# Patient Record
Sex: Female | Born: 1990 | Hispanic: No | Marital: Single | Smoking: Never smoker
Health system: Southern US, Community
[De-identification: ages and names within clinical notes are randomized; demographics above are authoritative.]

## PROBLEM LIST (undated history)

## (undated) DIAGNOSIS — R002 Palpitations: Secondary | ICD-10-CM

## (undated) DIAGNOSIS — I10 Essential (primary) hypertension: Secondary | ICD-10-CM

## (undated) DIAGNOSIS — D2330 Other benign neoplasm of skin of unspecified part of face: Secondary | ICD-10-CM

## (undated) DIAGNOSIS — F419 Anxiety disorder, unspecified: Secondary | ICD-10-CM

## (undated) DIAGNOSIS — I499 Cardiac arrhythmia, unspecified: Secondary | ICD-10-CM

## (undated) DIAGNOSIS — R040 Epistaxis: Secondary | ICD-10-CM

## (undated) DIAGNOSIS — F32A Depression, unspecified: Secondary | ICD-10-CM

## (undated) HISTORY — DX: Anxiety disorder, unspecified: F41.9

## (undated) HISTORY — DX: Depression, unspecified: F32.A

## (undated) HISTORY — DX: Palpitations: R00.2

## (undated) HISTORY — DX: Other benign neoplasm of skin of unspecified part of face: D23.30

---
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
## 2010-06-06 ENCOUNTER — Encounter: Payer: Self-pay | Admitting: Cardiology

## 2010-06-07 ENCOUNTER — Ambulatory Visit (INDEPENDENT_AMBULATORY_CARE_PROVIDER_SITE_OTHER): Payer: 59 | Admitting: Cardiology

## 2010-06-07 ENCOUNTER — Encounter: Payer: Self-pay | Admitting: Cardiology

## 2010-06-07 DIAGNOSIS — Z01818 Encounter for other preprocedural examination: Secondary | ICD-10-CM

## 2010-06-07 DIAGNOSIS — Z01810 Encounter for preprocedural cardiovascular examination: Secondary | ICD-10-CM

## 2010-06-07 DIAGNOSIS — R9431 Abnormal electrocardiogram [ECG] [EKG]: Secondary | ICD-10-CM | POA: Insufficient documentation

## 2010-06-07 DIAGNOSIS — R079 Chest pain, unspecified: Secondary | ICD-10-CM | POA: Insufficient documentation

## 2010-06-07 DIAGNOSIS — I499 Cardiac arrhythmia, unspecified: Secondary | ICD-10-CM

## 2010-06-07 HISTORY — DX: Encounter for preprocedural cardiovascular examination: Z01.810

## 2010-06-07 HISTORY — DX: Abnormal electrocardiogram (ECG) (EKG): R94.31

## 2010-06-07 LAB — BASIC METABOLIC PANEL
BUN: 9 mg/dL (ref 6–23)
CO2: 29 mEq/L (ref 19–32)
Calcium: 9.7 mg/dL (ref 8.4–10.5)
Chloride: 102 mEq/L (ref 96–112)
Creatinine, Ser: 0.5 mg/dL (ref 0.4–1.2)
GFR: 158.15 mL/min (ref >60.00)
Glucose, Bld: 85 mg/dL (ref 70–99)
Potassium: 4 mEq/L (ref 3.5–5.1)
Sodium: 139 mEq/L (ref 135–145)

## 2010-06-07 LAB — MAGNESIUM: Magnesium: 2.1 mg/dL (ref 1.5–2.5)

## 2010-06-07 LAB — TSH: TSH: 0.67 u[IU]/mL (ref 0.35–5.50)

## 2010-06-07 NOTE — Progress Notes (Signed)
HPI: Pleasant 20 year old female with no prior cardiac history for evaluation of abnormal electrocardiogram and preoperatively. She does not have dyspnea on exertion, orthopnea, PND, pedal edema, palpitations, syncope. She does notice chest pressure with more extreme activities relieved with rest. It does not radiate. There are no associated symptoms. She was recently being evaluated for a skin condition. She was noted to have an irregular heart rhythm and was seen at urgent care or an electrocardiogram. This revealed sinus rhythm with frequent PACs. Cardiology is asked to evaluate.  No current outpatient prescriptions on file.    No Known Allergies  No past medical history on file.  No past surgical history on file.  History   Social History  . Marital Status: Single    Spouse Name: N/A    Number of Children: N/A  . Years of Education: N/A   Occupational History  .  [employer]   Social History Main Topics  . Smoking status: Never Smoker   . Smokeless tobacco: Never Used  . Alcohol Use: No  . Drug Use: No  . Sexually Active: Not on file   Other Topics Concern  . Not on file   Social History Narrative  . No narrative on file    Family History  Problem Relation Age of Onset  . Heart disease Father     Unknown type    ROS: no fevers or chills, productive cough, hemoptysis, dysphasia, odynophagia, melena, hematochezia, dysuria, hematuria, rash, seizure activity, orthopnea, PND, pedal edema, claudication. Remaining systems are negative.  Physical Exam: General:  Well developed/well nourished in NAD Skin warm/dry Patient not depressed No peripheral clubbing Back-normal HEENT-normal/normal eyelids Neck supple/normal carotid upstroke bilaterally; no bruits; no JVD; no thyromegaly chest - CTA/ normal expansion CV -irregular/normal S1 and S2; no murmurs, rubs or gallops;  PMI nondisplaced Abdomen -NT/ND, no HSM, no mass, + bowel sounds, no bruit 2+  femoral pulses, no bruits Ext-no edema, chords, 2+ DP Neuro-grossly nonfocal  ECG Sinus rhythm with frequent and consecutive PACs. No ST changes. No delta wave noted.

## 2010-06-07 NOTE — Assessment & Plan Note (Signed)
Patient with asymptomatic PACs on electrocardiogram. I will schedule a stress echocardiogram given her chest pain and preoperative evaluation. This is mainly to screen for anomalous coronary artery. It will also help quantify LV function. Check TSH, magnesium and potassium.

## 2010-06-07 NOTE — Patient Instructions (Signed)
Your physician recommends that you schedule a follow-up appointment in: AS NEEDED  Your physician recommends that you continue on your current medications as directed. Please refer to the Current Medication list given to you today.   Your physician has requested that you have a stress echocardiogram. For further information please visit [email or web address] Please follow instruction sheet as given. DX ABN EKG AND  PRE OP    Your physician recommends that you return for lab work in: BMET TSH MAG DX  ABN EKG

## 2010-06-07 NOTE — Assessment & Plan Note (Signed)
Stress echocardiogram as described.

## 2010-06-07 NOTE — Assessment & Plan Note (Signed)
Schedule stress-echocardiogram.

## 2010-06-08 ENCOUNTER — Encounter: Payer: Self-pay | Admitting: *Deleted

## 2010-06-09 ENCOUNTER — Ambulatory Visit (HOSPITAL_COMMUNITY): Payer: 59 | Attending: Cardiology | Admitting: Radiology

## 2010-06-09 ENCOUNTER — Encounter: Payer: Self-pay | Admitting: Cardiology

## 2010-06-09 ENCOUNTER — Ambulatory Visit (HOSPITAL_BASED_OUTPATIENT_CLINIC_OR_DEPARTMENT_OTHER): Payer: 59 | Admitting: Radiology

## 2010-06-09 ENCOUNTER — Ambulatory Visit (INDEPENDENT_AMBULATORY_CARE_PROVIDER_SITE_OTHER): Payer: 59 | Admitting: Cardiology

## 2010-06-09 DIAGNOSIS — R079 Chest pain, unspecified: Secondary | ICD-10-CM

## 2010-06-09 DIAGNOSIS — I428 Other cardiomyopathies: Secondary | ICD-10-CM

## 2010-06-09 DIAGNOSIS — R0989 Other specified symptoms and signs involving the circulatory and respiratory systems: Secondary | ICD-10-CM

## 2010-06-09 DIAGNOSIS — I429 Cardiomyopathy, unspecified: Secondary | ICD-10-CM | POA: Insufficient documentation

## 2010-06-09 DIAGNOSIS — R9431 Abnormal electrocardiogram [ECG] [EKG]: Secondary | ICD-10-CM

## 2010-06-09 DIAGNOSIS — Z01818 Encounter for other preprocedural examination: Secondary | ICD-10-CM

## 2010-06-09 HISTORY — DX: Cardiomyopathy, unspecified: I42.9

## 2010-06-09 MED ORDER — METOPROLOL SUCCINATE ER 25 MG PO TB24: 25.0000 mg | ORAL_TABLET | Freq: Every day | ORAL | Status: DC

## 2010-06-09 NOTE — Assessment & Plan Note (Signed)
Await Myoview results prior to proceeding with surgery.

## 2010-06-09 NOTE — Progress Notes (Signed)
HPI: Pleasant 20 year old female with no prior cardiac history I recently saw for evaluation of abnormal electrocardiogram and preoperatively. Patient noted to have frequent PACs and runs of PAT on electrocardiogram. TSH and electrolytes normal. Echocardiogram was performed today. This showed an ejection fraction of 40-45%, global hypokinesis and trace mitral/tricuspid regurgitation.  No current outpatient prescriptions on file.    No Known Allergies  No past medical history on file.  No past surgical history on file.  History   Social History  . Marital Status: Single    Spouse Name: N/A    Number of Children: N/A  . Years of Education: N/A   Occupational History  .  [employer]   Social History Main Topics  . Smoking status: Never Smoker   . Smokeless tobacco: Never Used  . Alcohol Use: No  . Drug Use: No  . Sexually Active: Not on file   Other Topics Concern  . Not on file   Social History Narrative  . No narrative on file    Family History  Problem Relation Age of Onset  . Heart disease Father     Unknown type    ROS: no fevers or chills, productive cough, hemoptysis, dysphasia, odynophagia, melena, hematochezia, dysuria, hematuria, rash, seizure activity, orthopnea, PND, pedal edema, claudication. Remaining systems are negative.  Physical Exam: General:  Well developed/well nourished in NAD Skin warm/dry Patient not depressed No peripheral clubbing Back-normal HEENT-normal/normal eyelids Neck supple/normal carotid upstroke bilaterally; no bruits; no JVD; no thyromegaly chest - CTA/ normal expansion CV -irregular/normal S1 and S2; no murmurs, rubs or gallops;  PMI nondisplaced Abdomen -NT/ND, no HSM, no mass, + bowel sounds, no bruit 2+ femoral pulses, no bruits Ext-no edema, chords, 2+ DP Neuro-grossly nonfocal  ECG Sinus rhythm with frequent and consecutive PACs.

## 2010-06-09 NOTE — Patient Instructions (Signed)
Your physician recommends that you schedule a follow-up appointment in: 6 WEEKS WITH DR CRENSHAW  Your physician has recommended you make the following change in your medication: START TOPROL 25 MG EVERY DAY   Your physician has requested that you have a lexiscan myoview. For further information please visit [email or web address] Please follow instruction sheet, as given.

## 2010-06-09 NOTE — Assessment & Plan Note (Signed)
Patient has a new diagnosis of cardiomyopathy. Etiology is unclear. Her TSH is normal. There is no history of hypertension or alcohol use. There is no family history of cardiomyopathy. She does have chest pain with exertion. I would like to schedule a cardiac CT to rule out anomalous coronary artery. However given her frequent PACs and runs of PAT and we will not be able to pursue this at the study cannot be gated. I will schedule a Myoview given her exertional chest pain to screen for the above. I am also concerned that her frequent ectopy may be contributing to her cardiomyopathy. I will at toprol 25 mg daily. We may be limited in advancing the dose because of blood pressure. I will plan repeat echocardiogram in 3 months to see if LV function is improved once ectopy treated.

## 2010-06-09 NOTE — Assessment & Plan Note (Signed)
Myoview as described.

## 2010-06-15 ENCOUNTER — Ambulatory Visit (HOSPITAL_COMMUNITY): Payer: 59 | Attending: Cardiology | Admitting: Radiology

## 2010-06-15 DIAGNOSIS — R9431 Abnormal electrocardiogram [ECG] [EKG]: Secondary | ICD-10-CM

## 2010-06-15 DIAGNOSIS — R0789 Other chest pain: Secondary | ICD-10-CM

## 2010-06-15 DIAGNOSIS — R079 Chest pain, unspecified: Secondary | ICD-10-CM | POA: Insufficient documentation

## 2010-06-15 MED ORDER — REGADENOSON 0.4 MG/5ML IV SOLN
0.4000 mg | Freq: Once | INTRAVENOUS | Status: AC
  Administered 2010-06-15: 0.4 mg via INTRAVENOUS

## 2010-06-15 MED ORDER — TECHNETIUM TC 99M TETROFOSMIN IV KIT
10.6000 | PACK | Freq: Once | INTRAVENOUS | Status: AC | PRN
  Administered 2010-06-15: 11 via INTRAVENOUS

## 2010-06-15 MED ORDER — TECHNETIUM TC 99M TETROFOSMIN IV KIT
33.0000 | PACK | Freq: Once | INTRAVENOUS | Status: AC | PRN
  Administered 2010-06-15: 33 via INTRAVENOUS

## 2010-06-15 NOTE — Progress Notes (Signed)
[facility] SITE 3 NUCLEAR MED [address] [place] [address] [phone]  Cardiology Nuclear Med Study  [name] is a 20 y.o. female [id number] 09/17/1990   Nuclear Med Background Indication for Stress Test:  Evaluation for Ischemia, Surgical Clearance @ [facility], and Abnormal EKG History:  06/11/10 Echo:EF=40-45%, Cardiomyopathy.   Cardiac Risk Factors: Family History - CAD and Lipids  Symptoms:  Chest Pain/Pressure with Exertion    Nuclear Pre-Procedure Caffeine/Decaff Intake:  None NPO After: 8:00pm   Lungs:  Clear.  O2 sat 99% on RA IV 0.9% NS with Angio Cath:  20g  IV Site: R Antecubital  IV Started by:  Stanton Kidney, EMT-P  Chest Size (in):  34 Cup Size: B  Height: 5\' 3"  (1.6 m)  Weight:  124 lb (56.246 kg)  BMI:  Body mass index is 21.97 kg/(m^2). Tech Comments:  Metoprolol held this am    Nuclear Med Study 1 or 2 day study: 1 day  Stress Test Type:  Treadmill/Lexiscan  Reading MD: Arvilla Meres, MD  Order Authorizing Provider:  Olga Millers, MD  Resting Radionuclide: Technetium 39m Tetrofosmin  Resting Radionuclide Dose: 10.6 mCi   Stress Radionuclide:  Technetium 46m Tetrofosmin  Stress Radionuclide Dose: 33.0 mCi           Stress Protocol Rest HR: 80 Stress HR: 137  Rest BP: 102/81 Stress BP: 117/67  Exercise Time (min): 2:00 METS: n/a   Predicted Max HR: 199 bpm % Max HR: 68.84 bpm Rate Pressure Product: 73220   Dose of Adenosine (mg):  n/a Dose of Lexiscan: 0.4 mg  Dose of Atropine (mg): n/a Dose of Dobutamine: n/a mcg/kg/min (at max HR)  Stress Test Technologist: Smiley Houseman, CMA-N  Nuclear Technologist:  Domenic Polite, CNMT     Rest Procedure:  Myocardial perfusion imaging was performed at rest 45 minutes following the intravenous administration of Technetium 12m Tetrofosmin.  Rest ECG: Frequent PAC's.  Stress Procedure:  The patient received IV Lexiscan 0.4 mg over 15-seconds with concurrent low level  exercise and then Technetium 59m Tetrofosmin was injected at 30-seconds while the patient continued walking one more minute.  There were nonspecific T-wave changes and frequent PAC's with Lexiscan.  She c/o chest tightness with infusion.  Quantitative spect images were obtained after a 45-minute delay.  Stress ECG: No significant change from baseline ECG  QPS Raw Data Images:  Normal; no motion artifact; normal heart/lung ratio. Stress Images:  Normal homogeneous uptake in all areas of the myocardium. Rest Images:  Normal homogeneous uptake in all areas of the myocardium. Subtraction (SDS):  No evidence of ischemia. Transient Ischemic Dilatation (Normal <1.22):  0.94 Lung/Heart Ratio (Normal <0.45):  0.30  Quantitative Gated Spect Images QGS EDV: N/A QGS ESV:  N/A   mlQGS cine images:  study not gated QGS EF: Study not gated  Impression Exercise Capacity:  Lexiscan with low level exercise. BP Response:  n/a Clinical Symptoms:  n/a ECG Impression:  Not gated due to frequent and consecutive PACs. Comparison with Prior Nuclear Study: No previous nuclear study performed  Overall Impression:  Normal stress nuclear study. Normal perfusion. EF not assessed due to frequent PACs.       [name]

## 2010-06-16 NOTE — Progress Notes (Signed)
Copy routed to Dr. Crenshaw.[name] ° °

## 2010-06-20 NOTE — Progress Notes (Signed)
PT AWARE OF MYOVIEW RESULTS./CY 

## 2010-06-21 ENCOUNTER — Telehealth: Payer: Self-pay | Admitting: Cardiology

## 2010-06-21 NOTE — Telephone Encounter (Signed)
Pt on metoprolol and pcp said heart doing fine, pt wants to know if she needs to continue to take the med?

## 2010-06-21 NOTE — Telephone Encounter (Signed)
Left a message to call back.

## 2010-06-21 NOTE — Telephone Encounter (Signed)
Pt. States that  her PCP did an EKG during her last office visit, and was told that her  heart was doing fine. Pt. Would like to know if she still needs to continue taken the Metoprolol 25 mg daily. I let pt. Know that according to Dr. Jens Som last office visit, MD added Metoprolol 25 mg daily for her Dx of Cardiomyopathy. Patient verbalized understanding.

## 2010-06-24 ENCOUNTER — Encounter: Payer: Self-pay | Admitting: Cardiology

## 2010-06-24 ENCOUNTER — Telehealth: Payer: Self-pay | Admitting: Cardiology

## 2010-06-24 NOTE — Telephone Encounter (Signed)
Ok for surgery Brian Crenshaw  

## 2010-06-24 NOTE — Telephone Encounter (Signed)
Pt needs to have a letter stating her condition to [facility] dr. Dawna Part fax# [phone]. Pt having laser surgery.

## 2010-06-24 NOTE — Telephone Encounter (Signed)
Lexiscan stress test routed to Dr. Jens Som. Test was normal but I need to make sure she is cleared for surgery. LMTCB.

## 2010-06-24 NOTE — Telephone Encounter (Signed)
Called pt and advised [name] sent her stess test results  to dr Jens Som to read and OK for surgery--pt having laser facial surgery--advised pt to call back next week and ask for [place] for letter for clearance--pt agrees--nt

## 2010-06-24 NOTE — Telephone Encounter (Signed)
Error

## 2010-06-24 NOTE — Telephone Encounter (Signed)
Pt rtn your call pls call [phone]

## 2010-06-28 ENCOUNTER — Encounter: Payer: Self-pay | Admitting: Cardiology

## 2010-06-28 NOTE — Telephone Encounter (Signed)
Surgical clearance set to Dr. Dawna Part. Patient aware.

## 2010-06-28 NOTE — Telephone Encounter (Signed)
Will send surgical clearance.

## 2010-07-06 ENCOUNTER — Encounter: Payer: Self-pay | Admitting: Cardiology

## 2010-07-22 ENCOUNTER — Encounter: Payer: Self-pay | Admitting: Cardiology

## 2010-07-25 ENCOUNTER — Ambulatory Visit: Payer: 59 | Admitting: Cardiology

## 2010-07-26 ENCOUNTER — Encounter: Payer: Self-pay | Admitting: Cardiology

## 2010-07-27 ENCOUNTER — Encounter: Payer: Self-pay | Admitting: Cardiology

## 2010-09-21 ENCOUNTER — Other Ambulatory Visit: Payer: Self-pay | Admitting: Endocrinology

## 2010-09-21 DIAGNOSIS — R102 Pelvic and perineal pain: Secondary | ICD-10-CM

## 2010-09-22 ENCOUNTER — Inpatient Hospital Stay: Admission: RE | Admit: 2010-09-22 | Payer: 59 | Source: Ambulatory Visit

## 2010-09-29 ENCOUNTER — Ambulatory Visit (INDEPENDENT_AMBULATORY_CARE_PROVIDER_SITE_OTHER): Payer: 59 | Admitting: Cardiology

## 2010-09-29 ENCOUNTER — Encounter: Payer: Self-pay | Admitting: Cardiology

## 2010-09-29 DIAGNOSIS — I428 Other cardiomyopathies: Secondary | ICD-10-CM

## 2010-09-29 DIAGNOSIS — R079 Chest pain, unspecified: Secondary | ICD-10-CM

## 2010-09-29 MED ORDER — METOPROLOL SUCCINATE ER 25 MG PO TB24: 25.0000 mg | ORAL_TABLET | Freq: Every day | ORAL | Status: DC

## 2010-09-29 NOTE — Progress Notes (Signed)
[id number] 20 year old female with no prior cardiac history I recently saw for evaluation of abnormal electrocardiogram and preoperatively. Patient noted to have frequent PACs and runs of PAT on electrocardiogram. TSH and electrolytes normal. Echocardiogram was performed today. This showed an ejection fraction of 40-45%, global hypokinesis and trace mitral/tricuspid regurgitation. Myoview in May of 2012 showed normal perfusion; not gated due to ectopy.  I last saw her in May of 2012. Since then, the patient denies any dyspnea on exertion, orthopnea, PND, pedal edema, palpitations, syncope or chest pain.    Current Outpatient Prescriptions  Medication Sig Dispense Refill  . metoprolol succinate (TOPROL XL) 25 MG 24 hr tablet Take 1 tablet (25 mg total) by mouth daily.  30 tablet  11     Past Medical History  Diagnosis Date  . Abnormal EKG   . Cardiomyopathy     No past surgical history on file.  History   Social History  . Marital Status: Single    Spouse Name: N/A    Number of Children: N/A  . Years of Education: N/A   Occupational History  .  [employer]   Social History Main Topics  . Smoking status: Never Smoker   . Smokeless tobacco: Never Used  . Alcohol Use: No  . Drug Use: No  . Sexually Active: Not on file   Other Topics Concern  . Not on file   Social History Narrative  . No narrative on file    ROS: no fevers or chills, productive cough, hemoptysis, dysphasia, odynophagia, melena, hematochezia, dysuria, hematuria, rash, seizure activity, orthopnea, PND, pedal edema, claudication. Remaining systems are negative.  Physical Exam: Well-developed well-nourished in no acute distress.  Skin is warm and dry.  HEENT is normal.  Neck is supple. No thyromegaly.  Chest is clear to auscultation with normal expansion.  Cardiovascular exam is regular rate and rhythm.  Abdominal exam nontender or distended. No masses palpated. Extremities show no  edema. neuro grossly intact  ECG normal sinus rhythm at a rate of 78. Nonspecific ST changes.

## 2010-09-29 NOTE — Assessment & Plan Note (Signed)
Etiology unclear. Previous TSH normal. No history of alcohol abuse. Myoview does not suggest coronary disease and would be unlikely in a 20 year old female. She had frequent PVCs previously which may have contributed. Her palpitations have now improved. Continue Toprol. Repeat echocardiogram in November to see if LV function has improved after treating with beta blocker.

## 2010-09-29 NOTE — Patient Instructions (Addendum)
Your physician has requested that you have an echocardiogram. Echocardiography is a painless test that uses sound waves to create images of your heart. It provides your doctor with information about the size and shape of your heart and how well your heart's chambers and valves are working. This procedure takes approximately one hour. There are no restrictions for this procedure. [place]  Your physician recommends that you schedule a follow-up appointment in:  6 months with Dr. Jens Som

## 2010-09-29 NOTE — Assessment & Plan Note (Signed)
No further symptoms. Myoview negative. 

## 2010-10-06 ENCOUNTER — Other Ambulatory Visit: Payer: Self-pay | Admitting: *Deleted

## 2010-10-06 MED ORDER — METOPROLOL SUCCINATE ER 25 MG PO TB24: 25.0000 mg | ORAL_TABLET | Freq: Every day | ORAL | Status: DC

## 2010-12-13 ENCOUNTER — Ambulatory Visit (HOSPITAL_COMMUNITY): Payer: 59 | Attending: Cardiology | Admitting: Radiology

## 2010-12-13 DIAGNOSIS — I059 Rheumatic mitral valve disease, unspecified: Secondary | ICD-10-CM | POA: Insufficient documentation

## 2010-12-13 DIAGNOSIS — I079 Rheumatic tricuspid valve disease, unspecified: Secondary | ICD-10-CM | POA: Insufficient documentation

## 2010-12-13 DIAGNOSIS — R072 Precordial pain: Secondary | ICD-10-CM

## 2010-12-13 DIAGNOSIS — I428 Other cardiomyopathies: Secondary | ICD-10-CM | POA: Insufficient documentation

## 2010-12-13 DIAGNOSIS — R079 Chest pain, unspecified: Secondary | ICD-10-CM | POA: Insufficient documentation

## 2011-06-21 ENCOUNTER — Ambulatory Visit (INDEPENDENT_AMBULATORY_CARE_PROVIDER_SITE_OTHER): Payer: 59 | Admitting: Physician Assistant

## 2011-06-21 VITALS — BP 110/74 | HR 71 | Temp 98.3°F | Resp 16 | Ht 63.0 in | Wt 141.8 lb

## 2011-06-21 DIAGNOSIS — Z111 Encounter for screening for respiratory tuberculosis: Secondary | ICD-10-CM

## 2011-06-21 NOTE — Progress Notes (Signed)
   Patient ID: [name] MRN: [id number], DOB: 1990/02/15, 21 y.o. Date of Encounter: 06/21/2011, 4:34 PM  Primary Physician: No primary provider on file.  Chief Complaint: PPD placement  HPI: 21 y.o. year old female here for PPD. No prior positive PPD. Asymptomatic. Needs for job. Plans to work as a Lawyer. See scanned in yellow sheet.     Past Medical History  Diagnosis Date  . Abnormal EKG   . Cardiomyopathy      Home Meds: Prior to Admission medications   Medication Sig Start Date End Date Taking? Authorizing Provider  metoprolol succinate (TOPROL XL) 25 MG 24 hr tablet Take 1 tablet (25 mg total) by mouth daily. 10/06/10 10/06/11 Yes Lewayne Bunting, MD    Allergies: No Known Allergies  History   Social History  . Marital Status: Single    Spouse Name: N/A    Number of Children: N/A  . Years of Education: N/A   Occupational History  .  [employer]   Social History Main Topics  . Smoking status: Never Smoker   . Smokeless tobacco: Never Used  . Alcohol Use: No  . Drug Use: No  . Sexually Active: Not on file   Other Topics Concern  . Not on file   Social History Narrative  . No narrative on file     Review of Systems: Constitutional: negative for chills, fever, night sweats, weight changes, or fatigue  HEENT: negative for vision changes, hearing loss, or epistaxis Cardiovascular: negative for chest pain or palpitations Respiratory: negative for hemoptysis, wheezing, shortness of breath, or cough Abdominal: negative for abdominal pain, nausea, vomiting, diarrhea, or constipation Dermatological: negative for rash Neurologic: negative for headache, dizziness, or syncope All other systems reviewed and are otherwise negative with the exception to those above and in the HPI.   Physical Exam: Blood pressure 110/74, pulse 71, temperature 98.3 F (36.8 C), temperature source Oral, resp. rate 16, height 5\' 3"  (1.6 m), weight 141 lb 12.8  oz (64.32 kg)., Body mass index is 25.12 kg/(m^2). General: Well developed, well nourished, in no acute distress. Head: Normocephalic, atraumatic, eyes without discharge, sclera non-icteric, nares are without discharge.   Neck: Supple. No thyromegaly. Full ROM. No lymphadenopathy. Lungs: Clear bilaterally to auscultation without wheezes, rales, or rhonchi. Breathing is unlabored. Heart: RRR with S1 S2. No murmurs, rubs, or gallops appreciated. Msk:  Strength and tone normal for age. Extremities/Skin: Warm and dry. No clubbing or cyanosis. No edema. No rashes or suspicious lesions. Neuro: Alert and oriented X 3. Moves all extremities spontaneously. Gait is normal. CNII-XII grossly in tact. Psych:  Responds to questions appropriately with a normal affect.     ASSESSMENT AND PLAN:  21 y.o. year old female here for PPD. -PPD placed -RTC 48-72 hours for reading  Signed, Eula Listen, PA-C 06/21/2011 4:34 PM

## 2011-06-22 ENCOUNTER — Encounter: Payer: Self-pay | Admitting: *Deleted

## 2011-06-23 ENCOUNTER — Ambulatory Visit (INDEPENDENT_AMBULATORY_CARE_PROVIDER_SITE_OTHER): Payer: 59

## 2011-06-23 ENCOUNTER — Encounter (INDEPENDENT_AMBULATORY_CARE_PROVIDER_SITE_OTHER): Payer: 59

## 2011-06-23 ENCOUNTER — Encounter: Payer: Self-pay | Admitting: Cardiology

## 2011-06-23 ENCOUNTER — Ambulatory Visit (INDEPENDENT_AMBULATORY_CARE_PROVIDER_SITE_OTHER): Payer: 59 | Admitting: Cardiology

## 2011-06-23 VITALS — BP 110/70 | HR 67 | Ht 63.0 in | Wt 143.0 lb

## 2011-06-23 DIAGNOSIS — R002 Palpitations: Secondary | ICD-10-CM

## 2011-06-23 DIAGNOSIS — Z111 Encounter for screening for respiratory tuberculosis: Secondary | ICD-10-CM

## 2011-06-23 HISTORY — DX: Palpitations: R00.2

## 2011-06-23 NOTE — Assessment & Plan Note (Signed)
Symptoms sound potentially to be SVT. Plan CardioNet to further evaluate.

## 2011-06-23 NOTE — Assessment & Plan Note (Signed)
LV function has improved. Her previous reduction may have been related to her ectopy. Continue beta blocker.

## 2011-06-23 NOTE — Patient Instructions (Signed)
The current medical regimen is effective;  continue present plan and medications.  Your physician has recommended that you wear an event monitor for 21 days. Event monitors are medical devices that record the heart's electrical activity. Doctors most often [place] these monitors to diagnose arrhythmias. Arrhythmias are problems with the speed or rhythm of the heartbeat. The monitor is a small, portable device. You can wear one while you do your normal daily activities. This is usually used to diagnose what is causing palpitations/syncope (passing out).  Follow up with Dr Jens Som in 2 months

## 2011-06-23 NOTE — Progress Notes (Signed)
   HPI: Pleasant female for fu of CM. Patient previously noted to have frequent PACs and runs of PAT on electrocardiogram. TSH and electrolytes normal. Echocardiogram was performed and showed an ejection fraction of 40-45%, global hypokinesis and trace mitral/tricuspid regurgitation. Myoview in May of 2012 showed normal perfusion; not gated due to ectopy. Patient's reduced LV function felt possibly secondary to ectopy. Beta blocker added. Repeat echocardiogram in November 2012 showed normalization of ejection fraction at 55-60%. Since I last saw her I last saw her, she has had intermittent palpitations. She describes these as her heart racing. These are sudden in onset and can last up to 10 minutes. They resolve spontaneously. No associated symptoms. No syncope.   Current Outpatient Prescriptions  Medication Sig Dispense Refill  . metoprolol succinate (TOPROL XL) 25 MG 24 hr tablet Take 1 tablet (25 mg total) by mouth daily.  90 tablet  3     Past Medical History  Diagnosis Date  . Cardiomyopathy     History reviewed. No pertinent past surgical history.  History   Social History  . Marital Status: Single    Spouse Name: N/A    Number of Children: N/A  . Years of Education: N/A   Occupational History  .  [employer]   Social History Main Topics  . Smoking status: Never Smoker   . Smokeless tobacco: Never Used  . Alcohol Use: No  . Drug Use: No  . Sexually Active: Not on file   Other Topics Concern  . Not on file   Social History Narrative  . No narrative on file    ROS: no fevers or chills, productive cough, hemoptysis, dysphasia, odynophagia, melena, hematochezia, dysuria, hematuria, rash, seizure activity, orthopnea, PND, pedal edema, claudication. Remaining systems are negative.  Physical Exam: Well-developed well-nourished in no acute distress.  Skin is warm and dry.  HEENT is normal.  Neck is supple.  Chest is clear to auscultation with normal  expansion.  Cardiovascular exam is regular rate and rhythm.  Abdominal exam nontender or distended. No masses palpated. Extremities show no edema. neuro grossly intact  ECG sinus rhythm at a rate of 67. Sinus arrhythmia. No ST changes.

## 2011-07-19 ENCOUNTER — Telehealth: Payer: Self-pay | Admitting: *Deleted

## 2011-07-19 NOTE — Telephone Encounter (Signed)
Left message for pt to call, monitor reviewed by dr Jens Som shows sinus with rare PAC and brief PAT with brief atrial fib. Pt to keep follow up appt in July.

## 2011-08-01 ENCOUNTER — Encounter: Payer: Self-pay | Admitting: *Deleted

## 2011-08-01 NOTE — Telephone Encounter (Signed)
Unable to reach pt, letter of results mailed to pt

## 2011-08-28 ENCOUNTER — Encounter: Payer: Self-pay | Admitting: Cardiology

## 2011-08-28 ENCOUNTER — Ambulatory Visit (INDEPENDENT_AMBULATORY_CARE_PROVIDER_SITE_OTHER): Payer: 59 | Admitting: Cardiology

## 2011-08-28 VITALS — BP 117/78 | HR 60 | Ht 63.0 in | Wt 143.0 lb

## 2011-08-28 DIAGNOSIS — R002 Palpitations: Secondary | ICD-10-CM

## 2011-08-28 DIAGNOSIS — I428 Other cardiomyopathies: Secondary | ICD-10-CM

## 2011-08-28 DIAGNOSIS — I429 Cardiomyopathy, unspecified: Secondary | ICD-10-CM

## 2011-08-28 NOTE — Assessment & Plan Note (Signed)
Previously felt possibly secondary to ectopy. Most recent echocardiogram shows improvement to normal.

## 2011-08-28 NOTE — Progress Notes (Signed)
   HPI: Pleasant female for fu of CM. Patient previously noted to have frequent PACs and runs of PAT on electrocardiogram. TSH and electrolytes normal. Echocardiogram was performed and showed an ejection fraction of 40-45%, global hypokinesis and trace mitral/tricuspid regurgitation. Myoview in May of 2012 showed normal perfusion; not gated due to ectopy. Patient's reduced LV function felt possibly secondary to ectopy. Beta blocker added. Repeat echocardiogram in November 2012 showed normalization of ejection fraction at 55-60%. Patient had a cardiac event monitor in May of 2013 that shows sinus rhythm with PACs and PAT (question brief A. Fib). Since I last saw her in May of 2013, she has had occasional brief palpitations per seconds but not sustained. Otherwise no dyspnea, chest pain or syncope. Overall her palpitations have improved.   Current Outpatient Prescriptions  Medication Sig Dispense Refill  . metoprolol succinate (TOPROL XL) 25 MG 24 hr tablet Take 1 tablet (25 mg total) by mouth daily.  90 tablet  3     Past Medical History  Diagnosis Date  . Cardiomyopathy     No past surgical history on file.  History   Social History  . Marital Status: Single    Spouse Name: N/A    Number of Children: N/A  . Years of Education: N/A   Occupational History  .  [employer]   Social History Main Topics  . Smoking status: Never Smoker   . Smokeless tobacco: Never Used  . Alcohol Use: No  . Drug Use: No  . Sexually Active: Not on file   Other Topics Concern  . Not on file   Social History Narrative  . No narrative on file    ROS: no fevers or chills, productive cough, hemoptysis, dysphasia, odynophagia, melena, hematochezia, dysuria, hematuria, rash, seizure activity, orthopnea, PND, pedal edema, claudication. Remaining systems are negative.  Physical Exam: Well-developed well-nourished in no acute distress.  Skin is warm and dry.  HEENT is normal.  Neck  is supple.  Chest is clear to auscultation with normal expansion.  Cardiovascular exam is regular rate and rhythm.  Abdominal exam nontender or distended. No masses palpated. Extremities show no edema. neuro grossly intact

## 2011-08-28 NOTE — Assessment & Plan Note (Signed)
Event monitor shows PACs, brief PAT with question of brief atrial fibrillation. Percent symptoms had improved with low-dose Toprol. We will continue at present dose. We will increase to 50 mg in the future if her symptoms worsen. Her LV function is normal. Note she has no embolic risk factors and therefore does not require anticoagulation.

## 2011-08-28 NOTE — Patient Instructions (Addendum)
Your physician wants you to follow-up in: 6 MONTHS WITH DR CRENSHAW You will receive a reminder letter in the mail two months in advance. If you don't receive a letter, please call our office to schedule the follow-up appointment.  

## 2011-11-07 ENCOUNTER — Ambulatory Visit (INDEPENDENT_AMBULATORY_CARE_PROVIDER_SITE_OTHER): Payer: 59 | Admitting: Family Medicine

## 2011-11-07 DIAGNOSIS — Z23 Encounter for immunization: Secondary | ICD-10-CM

## 2011-11-21 ENCOUNTER — Ambulatory Visit (INDEPENDENT_AMBULATORY_CARE_PROVIDER_SITE_OTHER): Payer: 59 | Admitting: Family Medicine

## 2011-11-21 VITALS — BP 105/78 | HR 77 | Temp 98.4°F | Resp 18 | Wt 139.0 lb

## 2011-11-21 DIAGNOSIS — R21 Rash and other nonspecific skin eruption: Secondary | ICD-10-CM

## 2011-11-21 DIAGNOSIS — L299 Pruritus, unspecified: Secondary | ICD-10-CM

## 2011-11-21 MED ORDER — PERMETHRIN 5 % EX CREA: TOPICAL_CREAM | Freq: Once | CUTANEOUS | Status: DC

## 2011-11-21 MED ORDER — HYDROXYZINE HCL 10 MG PO TABS: 10.0000 mg | ORAL_TABLET | Freq: Three times a day (TID) | ORAL | Status: DC | PRN

## 2011-11-21 NOTE — Progress Notes (Signed)
[facility]:  Office Visit  Chief Complaint:  Chief Complaint  Patient presents with  . Rash    arms, legs and stomach    HPI: [name] is a 21 y.o. female who complains of 2 day h/o rash which started on her hands and then to her chest and down her leg, itches mostly at night. Sister has it as well. She denies new meds, detergents, soaps, foods, travels,s taying at hotel rooms. HAs not tried anythign for this. Has itching between web spaces of fingers. Pt is not sharing bed with anyone in family.   Past Medical History  Diagnosis Date  . Cardiomyopathy   . Heart palpitations   . Facial angiofibroma    History reviewed. No pertinent past surgical history. History   Social History  . Marital Status: Single    Spouse Name: N/A    Number of Children: N/A  . Years of Education: N/A   Occupational History  .  [employer]   Social History Main Topics  . Smoking status: Never Smoker   . Smokeless tobacco: Never Used  . Alcohol Use: No  . Drug Use: No  . Sexually Active: None   Other Topics Concern  . None   Social History Narrative  . None   Family History  Problem Relation Age of Onset  . Heart disease Father     Unknown type   No Known Allergies Prior to Admission medications   Medication Sig Start Date End Date Taking? Authorizing Provider  metoprolol succinate (TOPROL XL) 25 MG 24 hr tablet Take 1 tablet (25 mg total) by mouth daily. 10/06/10 10/06/11  Lewayne Bunting, MD     ROS: The patient denies fevers, chills, night sweats, unintentional weight loss, chest pain, palpitations, wheezing, dyspnea on exertion, nausea, vomiting, abdominal pain, dysuria, hematuria, melena, numbness, weakness, or tingling.  All other systems have been reviewed and were otherwise negative with the exception of those mentioned in the HPI and as above.    PHYSICAL EXAM: Filed Vitals:   11/21/11 1022  BP: 105/78  Pulse: 77    Temp: 98.4 F (36.9 C)  Resp: 18   Filed Vitals:   11/21/11 1022  Weight: 139 lb (63.05 kg)   There is no height on file to calculate BMI.  General: Alert, no acute distress HEENT:  Normocephalic, atraumatic, oropharynx patent.  Cardiovascular:  Regular rate and rhythm, no rubs murmurs or gallops.  No Carotid bruits, radial pulse intact. No pedal edema.  Respiratory: Clear to auscultation bilaterally.  No wheezes, rales, or rhonchi.  No cyanosis, no use of accessory musculature GI: No organomegaly, abdomen is soft and non-tender, positive bowel sounds.  No masses. Skin: + rashes, small excoriated papules in patches on chest, in webs of hands and on thigh Neurologic: Facial musculature symmetric. Psychiatric: Patient is appropriate throughout our interaction. Lymphatic: No cervical lymphadenopathy Musculoskeletal: Gait intact.    LABS: Results for orders placed in visit on 06/21/11  TB SKIN TEST      Component Value Range   TB Skin Test Negative     Induration 0       EKG/XRAY:   Primary read interpreted by Dr. Conley Rolls at [facility].   ASSESSMENT/PLAN: Encounter Diagnoses  Name Primary?  . Rash and nonspecific skin eruption Yes  . Itching     ? Scabies Rx Permethrin lotion apply on body avoid mouth and eyes, perhaps face for  her too since she has angifibromas leave on for 8-12 hrs then wash off. Rx also Vistaril F/u prn   Reinaldo Helt PHUONG, DO 11/21/2011 11:09 AM

## 2012-02-27 ENCOUNTER — Ambulatory Visit (INDEPENDENT_AMBULATORY_CARE_PROVIDER_SITE_OTHER): Payer: 59 | Admitting: Cardiology

## 2012-02-27 ENCOUNTER — Encounter: Payer: Self-pay | Admitting: Cardiology

## 2012-02-27 VITALS — BP 115/80 | HR 87 | Wt 148.0 lb

## 2012-02-27 DIAGNOSIS — I428 Other cardiomyopathies: Secondary | ICD-10-CM

## 2012-02-27 MED ORDER — METOPROLOL SUCCINATE ER 25 MG PO TB24: 25.0000 mg | ORAL_TABLET | Freq: Every day | ORAL | Status: DC

## 2012-02-27 NOTE — Assessment & Plan Note (Signed)
Improved on most recent echo. 

## 2012-02-27 NOTE — Progress Notes (Signed)
   HPI: Pleasant female for fu of CM. Patient previously noted to have frequent PACs and runs of PAT on electrocardiogram. TSH and electrolytes normal. Echocardiogram was performed and showed an ejection fraction of 40-45%, global hypokinesis and trace mitral/tricuspid regurgitation. Myoview in May of 2012 showed normal perfusion; not gated due to ectopy. Patient's reduced LV function felt possibly secondary to ectopy. Beta blocker added. Repeat echocardiogram in November 2012 showed normalization of ejection fraction at 55-60%. Patient had a cardiac event monitor in May of 2013 that shows sinus rhythm with PACs and PAT (question brief A. Fib). Since I last saw her in July of 2013, she has had occasional brief palpitations for seconds but not sustained. Otherwise no dyspnea, chest pain or syncope.    Current Outpatient Prescriptions  Medication Sig Dispense Refill  . hydrOXYzine (ATARAX/VISTARIL) 10 MG tablet Take 1 tablet (10 mg total) by mouth 3 (three) times daily as needed for itching.  30 tablet  0  . metoprolol succinate (TOPROL XL) 25 MG 24 hr tablet Take 1 tablet (25 mg total) by mouth daily.  90 tablet  3  . permethrin (ELIMITE) 5 % cream Apply topically once.  60 g  0     Past Medical History  Diagnosis Date  . Cardiomyopathy   . Heart palpitations   . Facial angiofibroma     No past surgical history on file.  History   Social History  . Marital Status: Single    Spouse Name: N/A    Number of Children: N/A  . Years of Education: N/A   Occupational History  .  [employer]   Social History Main Topics  . Smoking status: Never Smoker   . Smokeless tobacco: Never Used  . Alcohol Use: No  . Drug Use: No  . Sexually Active: Not on file   Other Topics Concern  . Not on file   Social History Narrative  . No narrative on file    ROS: no fevers or chills, productive cough, hemoptysis, dysphasia, odynophagia, melena, hematochezia, dysuria, hematuria,  rash, seizure activity, orthopnea, PND, pedal edema, claudication. Remaining systems are negative.  Physical Exam: Well-developed well-nourished in no acute distress.  Skin is warm and dry.  HEENT is normal.  Neck is supple.  Chest is clear to auscultation with normal expansion.  Cardiovascular exam is regular rate and rhythm.  Abdominal exam nontender or distended. No masses palpated. Extremities show no edema. neuro grossly intact  ECG sinus rhythm at a rate of 87. Right axis deviation.

## 2012-02-27 NOTE — Assessment & Plan Note (Signed)
Control with Toprol.we'll continue.

## 2012-02-27 NOTE — Patient Instructions (Addendum)
Your physician wants you to follow-up in: ONE YEAR WITH DR CRENSHAW You will receive a reminder letter in the mail two months in advance. If you don't receive a letter, please call our office to schedule the follow-up appointment.  

## 2012-03-01 NOTE — Addendum Note (Signed)
Addended by: Reine Just on: 03/01/2012 10:26 AM   Modules accepted: Orders

## 2012-06-18 ENCOUNTER — Ambulatory Visit (INDEPENDENT_AMBULATORY_CARE_PROVIDER_SITE_OTHER): Payer: 59

## 2012-06-18 VITALS — BP 98/62 | HR 76 | Temp 99.0°F | Resp 16 | Ht 62.0 in | Wt 141.0 lb

## 2012-06-18 DIAGNOSIS — Z111 Encounter for screening for respiratory tuberculosis: Secondary | ICD-10-CM

## 2012-06-18 NOTE — Progress Notes (Signed)
  Subjective:    Patient ID: [name], female    DOB: 07/25/1990, 22 y.o.   MRN: [id number]  HPI Pt here for 2 step PPD-works at [employer]-  Ok to give per Rhoderick Moody, PA-C. Pt to RTC 48-72 hrs and then again in 2 weeks for the second placement.    Tuberculosis Risk Questionnaire  1. Were you born outside the [place] in one of the following parts of the world: [place], [place], [place], [place] or [place]?  No  2. Have you traveled outside the [place] and lived for more than one month in one of the following parts of the world: [place], [place], [place], [place] or [place]?  No  3. Do you have a compromised immune system such as from any of the following conditions:HIV/AIDS, organ or bone marrow transplantation, diabetes, immunosuppressive medicines (e.g. Prednisone, Remicaide), leukemia, lymphoma, cancer of the head or neck, gastrectomy or jejunal bypass, end-stage renal disease (on dialysis), or silicosis?  No   4. Have you ever or do you plan on working in: a residential care center, a health care facility, a jail or prison or homeless shelter?  Yes  5. Have you ever: injected illegal drugs, used crack cocaine, lived in a homeless shelter  or been in jail or prison?   No  6. Have you ever been exposed to anyone with infectious tuberculosis?  No   Tuberculosis Symptom Questionnaire  Do you currently have any of the following symptoms?  1. Unexplained cough lasting more than 3 weeks? No  Unexplained fever lasting more than 3 weeks. No   3. Night Sweats (sweating that leaves the bedclothes and sheets wet)   No  4. Shortness of Breath No  5. Chest Pain No  6. Unintentional weight loss  No  7. Unexplained fatigue (very tired for no reason) No     Review of Systems     Objective:   Physical Exam        Assessment & Plan:

## 2012-06-20 ENCOUNTER — Encounter (INDEPENDENT_AMBULATORY_CARE_PROVIDER_SITE_OTHER): Payer: 59

## 2012-06-20 DIAGNOSIS — Z111 Encounter for screening for respiratory tuberculosis: Secondary | ICD-10-CM

## 2012-06-20 LAB — TB SKIN TEST
Induration: 0 mm
TB Skin Test: NEGATIVE

## 2012-08-16 ENCOUNTER — Encounter (HOSPITAL_COMMUNITY): Payer: Self-pay | Admitting: Emergency Medicine

## 2012-08-16 ENCOUNTER — Emergency Department (HOSPITAL_COMMUNITY)
Admission: EM | Admit: 2012-08-16 | Discharge: 2012-08-16 | Disposition: A | Discharge status: Home / Self Care | Payer: 59 | Attending: Emergency Medicine | Admitting: Emergency Medicine

## 2012-08-16 DIAGNOSIS — R042 Hemoptysis: Secondary | ICD-10-CM | POA: Insufficient documentation

## 2012-08-16 DIAGNOSIS — R040 Epistaxis: Secondary | ICD-10-CM | POA: Insufficient documentation

## 2012-08-16 DIAGNOSIS — Z8679 Personal history of other diseases of the circulatory system: Secondary | ICD-10-CM | POA: Insufficient documentation

## 2012-08-16 DIAGNOSIS — H44819 Hemophthalmos, unspecified eye: Secondary | ICD-10-CM | POA: Insufficient documentation

## 2012-08-16 HISTORY — DX: Cardiac arrhythmia, unspecified: I49.9

## 2012-08-16 LAB — CBC
HCT: 40.4 % (ref 36.0–46.0)
Hemoglobin: 13.7 g/dL (ref 12.0–15.0)
WBC: 10.8 10*3/uL — ABNORMAL HIGH (ref 4.0–10.5)

## 2012-08-16 NOTE — ED Notes (Signed)
[id number]<QI> Expected date:<BR> Expected time:<BR> Means of arrival:<BR> Comments:<BR> EMS-bloody nose/eye/mouth

## 2012-08-16 NOTE — ED Provider Notes (Signed)
History    CSN: [id number] Arrival date & time 08/16/12  1628  First MD Initiated Contact with Patient 08/16/12 1732     Chief Complaint  Patient presents with  . Epistaxis  . eye bleed    (Consider location/radiation/quality/duration/timing/severity/associated sxs/prior Treatment) HPI Comments: Patient is a 22 y/o female who presents for epistaxis with onset 2 hours ago. Patient states that she was sitting watching TV when her nose spontaneously began bleeding. She tried applying pressure without relief. Symptoms have been persistent, but mildly improved, since onset. She admits to associated hemoptysis and blood draining from the lower corner of her L eye; bleeding from eye has fully resolved. She denies the use of blood thinners, bleeding disorders, recent trauma, and nose picking. She further denies lightheadedness, dizziness, SOB, syncope, and fever. She states she had this problem 10 years ago and went to see an ENT specialist who applied auralgan to her b/l nares with complete relief of symptoms.  Patient is a 22 y.o. female presenting with nosebleeds. The history is provided by the patient and a parent. No language interpreter was used.  Epistaxis  Past Medical History  Diagnosis Date  . Irregular heartbeat    History reviewed. No pertinent past surgical history. No family history on file. History  Substance Use Topics  . Smoking status: Never Smoker   . Smokeless tobacco: Not on file  . Alcohol Use: No   OB History   Grav Para Term Preterm Abortions TAB SAB Ect Mult Living                 Review of Systems  HENT: Positive for nosebleeds.   All other systems reviewed and are negative.   Allergies  Review of patient's allergies indicates no known allergies.  Home Medications   Current Outpatient Rx  Name  Route  Sig  Dispense  Refill  . metoprolol succinate (TOPROL-XL) 25 MG 24 hr tablet   Oral   Take 25 mg by mouth daily as needed (high blood pressure).           BP 136/84  Pulse 113  Resp 21  Ht 5\' 2"  (1.575 m)  Wt 138 lb 5 oz (62.738 kg)  BMI 25.29 kg/m2  SpO2 98%  LMP 06/16/2012  Physical Exam  Nursing note and vitals reviewed. Constitutional: She is oriented to person, place, and time. She appears well-developed and well-nourished. No distress.  HENT:  Head: Normocephalic and atraumatic.  Right Ear: Tympanic membrane, external ear and ear canal normal.  Left Ear: Tympanic membrane, external ear and ear canal normal.  Nose: No nasal septal hematoma. Epistaxis (L nare; suspect posterior source) is observed.  Mouth/Throat: Uvula is midline and mucous membranes are normal.  No anterior source of bleeding identified in b/l nares. + blood in oropharynx secondary to epistaxis. Airway patent.  Eyes: Conjunctivae and EOM are normal. Pupils are equal, round, and reactive to light. No scleral icterus.  No pain with EOMs. No hyphema, conjunctival injection, or conjunctival hemorrhage b/l. No gross blood appreciated behind superior or inferior eyelid.  Neck: Normal range of motion. Neck supple.  Cardiovascular: Normal rate, regular rhythm and intact distal pulses.   Pulmonary/Chest: Effort normal. No respiratory distress.  Musculoskeletal: Normal range of motion.  Neurological: She is alert and oriented to person, place, and time.  Skin: Skin is warm and dry. No rash noted. She is not diaphoretic. No erythema. No pallor.  Psychiatric: She has a normal mood and affect. Her behavior  is normal.    ED Course  Procedures (including critical care time) Labs Reviewed  CBC - Abnormal; Notable for the following:    WBC 10.8 (*)    All other components within normal limits   No results found.  1. Left-sided epistaxis    MDM  Patient presents for L sided epistaxis. Bleeding controlled with continuous pressure and Afrin. H/H stable. On reexamination, no anterior source of bleeding able to be identified after blood and clots thoroughly  suctioned; suspect posterior epistaxis. Patient gargled with water to clear oropharynx of blood; reexamination of the oropharynx after gargling without evidence of continued bleeding. Patient would like to avoid packing with Rhino Rocket at this time. Patient hemodynamically stable and afebrile. Appropriate for d/c with ENT follow up. Strict return precautions given should bleeding persist or recur. Patient and parents verbalize comfort and understanding with this d/c plan with no unaddressed concerns.     Antony Madura, PA-C 08/16/12 2111

## 2012-08-16 NOTE — ED Notes (Signed)
Pt verbalizes understanding 

## 2012-08-16 NOTE — ED Notes (Addendum)
Pt's father at nurse's desk asking when will pt be seen by a doctor.   Went to EDPs office and made EDP and PAs aware of request and pt's condition.

## 2012-08-16 NOTE — ED Notes (Signed)
Per EMS pt was at home when her nose started bleeding, as well as bleeding coming from her mouth and her left eye.  Pt states that she had nose bleed when she was in the third grade and had to get it packed to stop the bleeding.

## 2012-08-17 ENCOUNTER — Encounter (HOSPITAL_COMMUNITY): Payer: Self-pay | Admitting: Emergency Medicine

## 2012-08-17 ENCOUNTER — Emergency Department (HOSPITAL_COMMUNITY)
Admission: EM | Admit: 2012-08-17 | Discharge: 2012-08-17 | Disposition: A | Discharge status: Home / Self Care | Payer: 59 | Attending: Emergency Medicine | Admitting: Emergency Medicine

## 2012-08-17 DIAGNOSIS — Z8679 Personal history of other diseases of the circulatory system: Secondary | ICD-10-CM | POA: Insufficient documentation

## 2012-08-17 DIAGNOSIS — Z79899 Other long term (current) drug therapy: Secondary | ICD-10-CM | POA: Insufficient documentation

## 2012-08-17 DIAGNOSIS — R040 Epistaxis: Secondary | ICD-10-CM | POA: Insufficient documentation

## 2012-08-17 MED ORDER — AMOXICILLIN-POT CLAVULANATE 500-125 MG PO TABS: 1.0000 | ORAL_TABLET | Freq: Three times a day (TID) | ORAL | Status: DC

## 2012-08-17 MED ORDER — TRAMADOL HCL 50 MG PO TABS: 50.0000 mg | ORAL_TABLET | Freq: Four times a day (QID) | ORAL | Status: DC | PRN

## 2012-08-17 MED ORDER — OXYMETAZOLINE HCL 0.05 % NA SOLN
NASAL | Status: AC
  Filled 2012-08-17: qty 15

## 2012-08-17 MED ORDER — AMOXICILLIN 500 MG PO CAPS
ORAL_CAPSULE | ORAL | Status: AC
  Filled 2012-08-17: qty 1

## 2012-08-17 MED ORDER — AMOXICILLIN-POT CLAVULANATE 500-125 MG PO TABS
1.0000 | ORAL_TABLET | Freq: Three times a day (TID) | ORAL | Status: DC
  Administered 2012-08-17: 500 mg via ORAL
  Filled 2012-08-17 (×3): qty 1

## 2012-08-17 NOTE — ED Notes (Signed)
Pt c/o nosebleed onset yesterday, pt was seen here for same yesterday. Pt is actively bleeding from nares and L eye.

## 2012-08-17 NOTE — ED Provider Notes (Signed)
History    CSN: [id number] Arrival date & time 08/17/12  4098  First MD Initiated Contact with Patient 08/17/12 [phone]     Chief Complaint  Patient presents with  . Epistaxis   (Consider location/radiation/quality/duration/timing/severity/associated sxs/prior Treatment) Patient is a 22 y.o. female presenting with nosebleeds. The history is provided by the patient and a relative.  Epistaxis Location:  L nare Severity:  Severe Duration:  20 minutes Timing:  Constant Progression:  Worsening Chronicity:  Recurrent Context: not anticoagulants, not aspirin use, not BiPAP, not drug use, not elevation change, not foreign body, not nose picking, not recent infection and not thrombocytopenia   Relieved by:  Nothing Ineffective treatments:  Applying pressure Associated symptoms: blood in oropharynx   Risk factors: no alcohol use, no allergies, no change in medication, no frequent nosebleeds, no head and neck surgery, no head and neck tumor, no intranasal steroids, no liver disease, no radiation treatment, no recent chemotherapy, no recent nasal surgery and no sinus problems   Pt seen earlier for left nare bleeding, thought to be posterior.  Resolved with afrin and compression.  Pt reports return of bleeding just prior to arrival, worse than before.  Past Medical History  Diagnosis Date  . Cardiomyopathy   . Heart palpitations   . Facial angiofibroma    History reviewed. No pertinent past surgical history. Family History  Problem Relation Age of Onset  . Heart disease Father     Unknown type   History  Substance Use Topics  . Smoking status: Never Smoker   . Smokeless tobacco: Never Used  . Alcohol Use: No   OB History   Grav Para Term Preterm Abortions TAB SAB Ect Mult Living                 Review of Systems  Unable to perform ROS: Acuity of condition  HENT: Positive for nosebleeds.     Allergies  Review of patient's allergies indicates no known allergies.  Home  Medications   Current Outpatient Rx  Name  Route  Sig  Dispense  Refill  . metoprolol succinate (TOPROL XL) 25 MG 24 hr tablet   Oral   Take 1 tablet (25 mg total) by mouth daily.   90 tablet   3   . amoxicillin-clavulanate (AUGMENTIN) 500-125 MG per tablet   Oral   Take 1 tablet (500 mg total) by mouth 3 (three) times daily.   15 tablet   0   . traMADol (ULTRAM) 50 MG tablet   Oral   Take 1 tablet (50 mg total) by mouth every 6 (six) hours as needed for pain.   15 tablet   0    BP 135/94  Pulse 132  Resp 18  Wt 138 lb (62.596 kg)  BMI 25.23 kg/m2  SpO2 99%  LMP 06/17/2012 Physical Exam  Constitutional: She is oriented to person, place, and time. She appears distressed.  HENT:  Head: Normocephalic and atraumatic.  Mouth/Throat: Oropharynx is clear and moist.  Brisk bleeding from left nare, unable to see source due to amount of bleeding.  Smaller amount from right nare, no source noted anteriorly.    Cardiovascular: Normal rate, regular rhythm, normal heart sounds and intact distal pulses.  Exam reveals no gallop and no friction rub.   No murmur heard. Pulmonary/Chest: Effort normal and breath sounds normal. No respiratory distress. She has no wheezes. She has no rales. She exhibits no tenderness.  Abdominal: Soft. Bowel sounds are normal. She exhibits  no distension and no mass. There is no tenderness. There is no rebound and no guarding.  Musculoskeletal: Normal range of motion. She exhibits no edema and no tenderness.  Neurological: She is alert and oriented to person, place, and time.  Skin: Skin is warm. No rash noted. No erythema. No pallor.    ED Course  EPISTAXIS MANAGEMENT Date/Time: 08/17/2012 3:45 AM Performed by: Olivia Mackie Authorized by: Olivia Mackie Consent: Verbal consent obtained. Risks and benefits: risks, benefits and alternatives were discussed Consent given by: patient Anesthesia: local infiltration Local anesthetic: lidocaine  spray Anesthetic total: 3 ml Patient sedated: no Treatment site: left posterior Repair method: merocel sponge Post-procedure assessment: bleeding stopped Treatment complexity: complex Recurrence: recurrence of recent bleed Patient tolerance: Patient tolerated the procedure well with no immediate complications.   (including critical care time) Labs Reviewed - No data to display No results found. 1. Epistaxis     MDM  22 yo female with recurrent left epistaxis.  Pt with brisk bleeding, controlled with 10 cm merocel packing.  D/w Dr Pollyann Kennedy who wishes to see the patient in the office in 5 days, start on abx prophylaxis in the meantime.  Pt and family updated.  Some slight oozing, but no further frank bleeding.  Pt and family are comfortable with d/c home.  Olivia Mackie, MD 08/17/12 [phone]

## 2012-08-19 ENCOUNTER — Encounter (HOSPITAL_COMMUNITY): Payer: Self-pay | Admitting: Emergency Medicine

## 2012-08-20 NOTE — ED Provider Notes (Signed)
Medical screening examination/treatment/procedure(s) were performed by non-physician practitioner and as supervising physician I was immediately available for consultation/collaboration.  Ngoc Daughtridge, MD 08/20/12 1610 

## 2012-08-25 ENCOUNTER — Encounter (HOSPITAL_COMMUNITY): Payer: Self-pay | Admitting: *Deleted

## 2012-08-25 ENCOUNTER — Emergency Department (HOSPITAL_COMMUNITY)
Admission: EM | Admit: 2012-08-25 | Discharge: 2012-08-26 | Disposition: A | Discharge status: Home / Self Care | Payer: 59 | Attending: Emergency Medicine | Admitting: Emergency Medicine

## 2012-08-25 DIAGNOSIS — Z79899 Other long term (current) drug therapy: Secondary | ICD-10-CM | POA: Insufficient documentation

## 2012-08-25 DIAGNOSIS — Z792 Long term (current) use of antibiotics: Secondary | ICD-10-CM | POA: Insufficient documentation

## 2012-08-25 DIAGNOSIS — Z8679 Personal history of other diseases of the circulatory system: Secondary | ICD-10-CM | POA: Insufficient documentation

## 2012-08-25 DIAGNOSIS — R000 Tachycardia, unspecified: Secondary | ICD-10-CM | POA: Insufficient documentation

## 2012-08-25 DIAGNOSIS — R040 Epistaxis: Secondary | ICD-10-CM | POA: Insufficient documentation

## 2012-08-25 HISTORY — DX: Epistaxis: R04.0

## 2012-08-25 MED ORDER — OXYMETAZOLINE HCL 0.05 % NA SOLN
1.0000 | Freq: Once | NASAL | Status: AC
  Administered 2012-08-25: 1 via NASAL
  Filled 2012-08-25: qty 15

## 2012-08-25 NOTE — ED Provider Notes (Signed)
CSN: [id number]     Arrival date & time 08/25/12  2304 History     First MD Initiated Contact with Patient 08/25/12 2312     Chief Complaint  Patient presents with  . Epistaxis   (Consider location/radiation/quality/duration/timing/severity/associated sxs/prior Treatment) HPI 22 yo female presents to the ER from home with complaint of nosebleed.  Pt was seen by me in the ER on 7/19 for same complaint, had merocel packing placed.  Pt was seen by Dr Pollyann Kennedy in the office on 7/24, had packing removed and NP scope done.  Pt reports she was told no heavy lifting, which she thinks she did tonight.  Bleeding for about 30 minutes heavily.  No anticoagulants, no aspirin, no steroids, liver disease, clotting disorders.    Past Medical History  Diagnosis Date  . Irregular heartbeat   . Cardiomyopathy   . Heart palpitations   . Facial angiofibroma   . Nosebleed    History reviewed. No pertinent past surgical history. Family History  Problem Relation Age of Onset  . Heart disease Father     Unknown type   History  Substance Use Topics  . Smoking status: Never Smoker   . Smokeless tobacco: Never Used  . Alcohol Use: No   OB History   Grav Para Term Preterm Abortions TAB SAB Ect Mult Living                 Review of Systems  All other systems reviewed and are negative.    Allergies  Review of patient's allergies indicates no known allergies.  Home Medications   Current Outpatient Rx  Name  Route  Sig  Dispense  Refill  . amoxicillin-clavulanate (AUGMENTIN) 500-125 MG per tablet   Oral   Take 1 tablet (500 mg total) by mouth 3 (three) times daily.   15 tablet   0   . metoprolol succinate (TOPROL XL) 25 MG 24 hr tablet   Oral   Take 1 tablet (25 mg total) by mouth daily.   90 tablet   3   . metoprolol succinate (TOPROL-XL) 25 MG 24 hr tablet   Oral   Take 25 mg by mouth daily as needed (high blood pressure).         . traMADol (ULTRAM) 50 MG tablet   Oral   Take 1  tablet (50 mg total) by mouth every 6 (six) hours as needed for pain.   15 tablet   0    BP 129/81  Pulse 121  Temp(Src) 98.7 F (37.1 C) (Oral)  Ht 5\' 2"  (1.575 m)  Wt 132 lb (59.875 kg)  BMI 24.14 kg/m2  SpO2 98%  LMP 08/25/2012 Physical Exam  Nursing note and vitals reviewed. Constitutional: She appears well-developed and well-nourished.  HENT:  Head: Normocephalic and atraumatic.  Mouth/Throat: Oropharynx is clear and moist.  Some blood noted in posterior pharynx, not actively bleeding.  No bleeding in right nare.  Heavy bleeding from left nare, does not appear to be anterior source, unable to appreciate posterior.  Cardiovascular: Regular rhythm, normal heart sounds and intact distal pulses.  Exam reveals no gallop and no friction rub.   No murmur heard. Tachycardia noted  Pulmonary/Chest: Effort normal and breath sounds normal. No respiratory distress. She has no wheezes. She has no rales. She exhibits no tenderness.  Abdominal: Soft. Bowel sounds are normal. She exhibits no distension and no mass. There is no tenderness. There is no rebound.  Skin: Skin is warm and dry.  No rash noted. No erythema. No pallor.    ED Course   EPISTAXIS MANAGEMENT Date/Time: 08/25/2012 11:55 PM Performed by: Olivia Mackie Authorized by: Olivia Mackie Consent: Verbal consent obtained. Risks and benefits: risks, benefits and alternatives were discussed Consent given by: patient and parent Patient identity confirmed: verbally with patient Patient sedated: no Repair method: merocel sponge (10 cm) Post-procedure assessment: bleeding decreased Recurrence: recurrence of recent bleed   (including critical care time)  Labs Reviewed - No data to display No results found. 1. Epistaxis, recurrent     MDM  22 yo female with recurrent epistaxis.  Initial management with afrin, pressure without improvement.  Merocel packing placed, with persistent steady dripping of blood.  Will d/w  ENT.   12:58 AM Bleeding now only slow drip.  D/w Dr Annalee Genta on call for Dr Pollyann Kennedy.  He recommends f/u in 3 days in the office.  Will place dressing over nares to catch drips, afrin/saline for drying of packing, abx.  Olivia Mackie, MD 08/26/12 [phone]

## 2012-08-25 NOTE — ED Notes (Addendum)
Active nosebleed times 30 min. Treated for last nose bleed Wed

## 2012-08-26 MED ORDER — AMOXICILLIN-POT CLAVULANATE 500-125 MG PO TABS: 1.0000 | ORAL_TABLET | Freq: Three times a day (TID) | ORAL | Status: DC

## 2012-08-26 MED ORDER — OXYMETAZOLINE HCL 0.05 % NA SOLN: 2.0000 | Freq: Two times a day (BID) | NASAL | Status: DC

## 2012-08-26 MED ORDER — AMOXICILLIN-POT CLAVULANATE 500-125 MG PO TABS
1.0000 | ORAL_TABLET | Freq: Three times a day (TID) | ORAL | Status: DC
  Filled 2012-08-26: qty 1

## 2012-08-28 ENCOUNTER — Encounter (HOSPITAL_COMMUNITY): Payer: Self-pay | Admitting: *Deleted

## 2012-08-28 ENCOUNTER — Ambulatory Visit (HOSPITAL_COMMUNITY): Payer: 59 | Admitting: Anesthesiology

## 2012-08-28 ENCOUNTER — Encounter (HOSPITAL_COMMUNITY): Payer: Self-pay | Admitting: Anesthesiology

## 2012-08-28 ENCOUNTER — Ambulatory Visit (HOSPITAL_COMMUNITY)
Admission: AD | Admit: 2012-08-28 | Discharge: 2012-08-28 | Disposition: A | Discharge status: Home / Self Care | Payer: 59 | Source: Ambulatory Visit | Attending: Otolaryngology | Admitting: Otolaryngology

## 2012-08-28 ENCOUNTER — Encounter (HOSPITAL_COMMUNITY)
Admission: AD | Disposition: A | Discharge status: Home / Self Care | Payer: Self-pay | Source: Ambulatory Visit | Attending: Otolaryngology

## 2012-08-28 DIAGNOSIS — R040 Epistaxis: Secondary | ICD-10-CM

## 2012-08-28 DIAGNOSIS — J342 Deviated nasal septum: Secondary | ICD-10-CM | POA: Insufficient documentation

## 2012-08-28 DIAGNOSIS — Z79899 Other long term (current) drug therapy: Secondary | ICD-10-CM | POA: Insufficient documentation

## 2012-08-28 DIAGNOSIS — R002 Palpitations: Secondary | ICD-10-CM | POA: Insufficient documentation

## 2012-08-28 DIAGNOSIS — I428 Other cardiomyopathies: Secondary | ICD-10-CM | POA: Insufficient documentation

## 2012-08-28 DIAGNOSIS — D367 Benign neoplasm of other specified sites: Secondary | ICD-10-CM | POA: Insufficient documentation

## 2012-08-28 HISTORY — DX: Epistaxis: R04.0

## 2012-08-28 HISTORY — PX: NASAL HEMORRHAGE CONTROL: SHX287

## 2012-08-28 LAB — CBC
HCT: 31.6 % — ABNORMAL LOW (ref 36.0–46.0)
Hemoglobin: 10.7 g/dL — ABNORMAL LOW (ref 12.0–15.0)
MCH: 28.2 pg (ref 26.0–34.0)
MCHC: 33.9 g/dL (ref 30.0–36.0)
MCV: 83.4 fL (ref 78.0–100.0)
RDW: 13.5 % (ref 11.5–15.5)

## 2012-08-28 SURGERY — CONTROL OF EPISTAXIS
Anesthesia: General | Site: Nose | Laterality: Bilateral | Wound class: Clean Contaminated

## 2012-08-28 MED ORDER — NEOSTIGMINE METHYLSULFATE 1 MG/ML IJ SOLN
INTRAMUSCULAR | Status: DC | PRN
  Administered 2012-08-28: 2 mg via INTRAVENOUS

## 2012-08-28 MED ORDER — ONDANSETRON HCL 4 MG/2ML IJ SOLN
INTRAMUSCULAR | Status: DC | PRN
  Administered 2012-08-28: 4 mg via INTRAVENOUS

## 2012-08-28 MED ORDER — LIDOCAINE HCL (CARDIAC) 20 MG/ML IV SOLN
INTRAVENOUS | Status: DC | PRN
  Administered 2012-08-28: 75 mg via INTRAVENOUS

## 2012-08-28 MED ORDER — HYDROMORPHONE HCL PF 1 MG/ML IJ SOLN
INTRAMUSCULAR | Status: AC
  Filled 2012-08-28: qty 1

## 2012-08-28 MED ORDER — OXYMETAZOLINE HCL 0.05 % NA SOLN
NASAL | Status: DC | PRN
  Administered 2012-08-28: 1

## 2012-08-28 MED ORDER — OXYCODONE HCL 5 MG PO TABS: 5.0000 mg | ORAL_TABLET | Freq: Once | ORAL | Status: DC | PRN

## 2012-08-28 MED ORDER — PROPOFOL 10 MG/ML IV BOLUS
INTRAVENOUS | Status: DC | PRN
  Administered 2012-08-28: 150 mg via INTRAVENOUS

## 2012-08-28 MED ORDER — ROCURONIUM BROMIDE 100 MG/10ML IV SOLN
INTRAVENOUS | Status: DC | PRN
  Administered 2012-08-28: 10 mg via INTRAVENOUS

## 2012-08-28 MED ORDER — MUPIROCIN CALCIUM 2 % EX CREA
TOPICAL_CREAM | CUTANEOUS | Status: DC | PRN
  Administered 2012-08-28: 1 via TOPICAL

## 2012-08-28 MED ORDER — ONDANSETRON HCL 4 MG/2ML IJ SOLN: 4.0000 mg | Freq: Once | INTRAMUSCULAR | Status: DC | PRN

## 2012-08-28 MED ORDER — LACTATED RINGERS IV SOLN
INTRAVENOUS | Status: DC | PRN
  Administered 2012-08-28 (×2): via INTRAVENOUS

## 2012-08-28 MED ORDER — LACTATED RINGERS IV SOLN
INTRAVENOUS | Status: DC
  Administered 2012-08-28: 16:00:00 via INTRAVENOUS

## 2012-08-28 MED ORDER — GLYCOPYRROLATE 0.2 MG/ML IJ SOLN
INTRAMUSCULAR | Status: DC | PRN
  Administered 2012-08-28: .4 mg via INTRAVENOUS

## 2012-08-28 MED ORDER — FENTANYL CITRATE 0.05 MG/ML IJ SOLN
INTRAMUSCULAR | Status: DC | PRN
  Administered 2012-08-28: 100 ug via INTRAVENOUS

## 2012-08-28 MED ORDER — LIDOCAINE-EPINEPHRINE 1 %-1:100000 IJ SOLN
INTRAMUSCULAR | Status: DC | PRN
  Administered 2012-08-28: 10 mL

## 2012-08-28 MED ORDER — HYDROMORPHONE HCL PF 1 MG/ML IJ SOLN
0.2500 mg | INTRAMUSCULAR | Status: DC | PRN
  Administered 2012-08-28: 0.25 mg via INTRAVENOUS

## 2012-08-28 MED ORDER — MIDAZOLAM HCL 5 MG/5ML IJ SOLN
INTRAMUSCULAR | Status: DC | PRN
  Administered 2012-08-28: 2 mg via INTRAVENOUS

## 2012-08-28 MED ORDER — DEXAMETHASONE SODIUM PHOSPHATE 10 MG/ML IJ SOLN
10.0000 mg | Freq: Once | INTRAMUSCULAR | Status: AC
  Administered 2012-08-28: 10 mg via INTRAVENOUS

## 2012-08-28 MED ORDER — 0.9 % SODIUM CHLORIDE (POUR BTL) OPTIME
TOPICAL | Status: DC | PRN
  Administered 2012-08-28: 1000 mL

## 2012-08-28 MED ORDER — OXYCODONE HCL 5 MG/5ML PO SOLN: 5.0000 mg | Freq: Once | ORAL | Status: DC | PRN

## 2012-08-28 MED ORDER — CEFAZOLIN SODIUM-DEXTROSE 2-3 GM-% IV SOLR
2.0000 g | Freq: Once | INTRAVENOUS | Status: DC
  Filled 2012-08-28: qty 50

## 2012-08-28 MED ORDER — CEFAZOLIN SODIUM 1-5 GM-% IV SOLN
1.0000 g | Freq: Once | INTRAVENOUS | Status: DC
  Administered 2012-08-28: 1 g via INTRAVENOUS

## 2012-08-28 SURGICAL SUPPLY — 27 items
CANISTER SUCTION 2500CC (MISCELLANEOUS) ×2 IMPLANT
CLOTH BEACON ORANGE TIMEOUT ST (SAFETY) ×2 IMPLANT
COAGULATOR SUCT 8FR VV (MISCELLANEOUS) ×2 IMPLANT
COVER MAYO STAND STRL (DRAPES) ×1 IMPLANT
GAUZE SPONGE 2X2 8PLY STRL LF (GAUZE/BANDAGES/DRESSINGS) IMPLANT
GAUZE SPONGE 4X4 16PLY XRAY LF (GAUZE/BANDAGES/DRESSINGS) ×1 IMPLANT
GAUZE VASELINE FOILPK 1/2 X 72 (GAUZE/BANDAGES/DRESSINGS) IMPLANT
GLOVE BIOGEL M 7.0 STRL (GLOVE) ×2 IMPLANT
GLOVE SURG SS PI 7.5 STRL IVOR (GLOVE) ×1 IMPLANT
GOWN STRL NON-REIN LRG LVL3 (GOWN DISPOSABLE) ×2 IMPLANT
HEMOSTAT SURGICEL .5X2 ABSORB (HEMOSTASIS) IMPLANT
KIT BASIN OR (CUSTOM PROCEDURE TRAY) ×2 IMPLANT
KIT ROOM TURNOVER OR (KITS) ×2 IMPLANT
NDL HYPO 25GX1X1/2 BEV (NEEDLE) ×1 IMPLANT
NEEDLE HYPO 25GX1X1/2 BEV (NEEDLE) IMPLANT
NS IRRIG 1000ML POUR BTL (IV SOLUTION) ×2 IMPLANT
PAD ARMBOARD 7.5X6 YLW CONV (MISCELLANEOUS) ×4 IMPLANT
SOLUTION ANTI FOG 6CC (MISCELLANEOUS) ×1 IMPLANT
SPLINT NASAL THERMO PLAST (MISCELLANEOUS) IMPLANT
SPONGE GAUZE 2X2 STER 10/PKG (GAUZE/BANDAGES/DRESSINGS)
SPONGE NEURO XRAY DETECT 1X3 (DISPOSABLE) ×2 IMPLANT
SUT CHROMIC 4 0 RB 1X27 (SUTURE) ×1 IMPLANT
SYR CONTROL 10ML LL (SYRINGE) ×1 IMPLANT
TOWEL OR 17X24 6PK STRL BLUE (TOWEL DISPOSABLE) ×4 IMPLANT
TRAY ENT MC OR (CUSTOM PROCEDURE TRAY) ×1 IMPLANT
TUBE CONNECTING 12X1/4 (SUCTIONS) ×1 IMPLANT
WATER STERILE IRR 1000ML POUR (IV SOLUTION) ×1 IMPLANT

## 2012-08-28 NOTE — Anesthesia Preprocedure Evaluation (Signed)
Anesthesia Evaluation  Patient identified by MRN, date of birth, ID band Patient awake    Reviewed: Allergy & Precautions, H&P , NPO status , Patient's Chart, lab work & pertinent test results  Airway Mallampati: II TM Distance: >3 FB Neck ROM: Full    Dental  (+) Teeth Intact and Dental Advisory Given   Pulmonary  breath sounds clear to auscultation        Cardiovascular Rhythm:Regular Rate:Normal     Neuro/Psych    GI/Hepatic   Endo/Other    Renal/GU      Musculoskeletal   Abdominal   Peds  Hematology   Anesthesia Other Findings Some question of "cardiomyopathy" but EF is good by 2012 echo.  Reproductive/Obstetrics                           Anesthesia Physical Anesthesia Plan  ASA: II  Anesthesia Plan: General   Post-op Pain Management:    Induction: Intravenous  Airway Management Planned: Oral ETT  Additional Equipment:   Intra-op Plan:   Post-operative Plan: Extubation in OR  Informed Consent: I have reviewed the patients History and Physical, chart, labs and discussed the procedure including the risks, benefits and alternatives for the proposed anesthesia with the patient or authorized representative who has indicated his/her understanding and acceptance.   Dental advisory given  Plan Discussed with: CRNA, Anesthesiologist and Surgeon  Anesthesia Plan Comments:         Anesthesia Quick Evaluation

## 2012-08-28 NOTE — Brief Op Note (Signed)
08/28/2012  5:36 PM  PATIENT:  [name]  22 y.o. female  PRE-OPERATIVE DIAGNOSIS:  Epitaxis  POST-OPERATIVE DIAGNOSIS:  Epitaxis  PROCEDURE:  Endoscopic Nasal Cautery  SURGEON:  Surgeon(s) and Role:    * Osborn Coho, MD - Primary  PHYSICIAN ASSISTANT:   ASSISTANTS: none   ANESTHESIA:   general  EBL:  Total I/O In: 1600 [I.V.:1600] Out: - 50cc  BLOOD ADMINISTERED:none  DRAINS: none   LOCAL MEDICATIONS USED:  NONE  SPECIMEN:  No Specimen  DISPOSITION OF SPECIMEN:  N/A  COUNTS:  YES  TOURNIQUET:  * No tourniquets in log *  DICTATION: .Other Dictation: Dictation Number [id number]  PLAN OF CARE: Discharge to home after PACU  PATIENT DISPOSITION:  PACU - hemodynamically stable.   Delay start of Pharmacological VTE agent (>24hrs) due to surgical blood loss or risk of bleeding: not applicable

## 2012-08-28 NOTE — Anesthesia Postprocedure Evaluation (Signed)
  Anesthesia Post-op Note  Patient: [name]  Procedure(s) Performed: Procedure(s): EPISTAXIS CONTROL (Bilateral)  Patient Location: PACU  Anesthesia Type:General  Level of Consciousness: awake, alert  and oriented  Airway and Oxygen Therapy: Patient Spontanous Breathing  Post-op Pain: mild  Post-op Assessment: Post-op Vital signs reviewed  Post-op Vital Signs: Reviewed  Complications: No apparent anesthesia complications

## 2012-08-28 NOTE — Anesthesia Procedure Notes (Signed)
Procedure Name: Intubation Date/Time: 08/28/2012 4:56 PM Performed by: Gwenyth Allegra Pre-anesthesia Checklist: Timeout performed, Patient identified, Emergency Drugs available, Suction available and Patient being monitored Patient Re-evaluated:Patient Re-evaluated prior to inductionOxygen Delivery Method: Circle system utilized Preoxygenation: Pre-oxygenation with 100% oxygen Intubation Type: IV induction, Rapid sequence and Cricoid Pressure applied Laryngoscope Size: Mac and 3 Grade View: Grade I Tube type: Oral Tube size: 7.0 mm Airway Equipment and Method: Stylet and LTA kit utilized Placement Confirmation: ETT inserted through vocal cords under direct vision,  breath sounds checked- equal and bilateral and positive ETCO2 Secured at: 21 cm Tube secured with: Tape Dental Injury: Teeth and Oropharynx as per pre-operative assessment

## 2012-08-28 NOTE — Transfer of Care (Signed)
Immediate Anesthesia Transfer of Care Note  Patient: [name]  Procedure(s) Performed: Procedure(s): EPISTAXIS CONTROL (Bilateral)  Patient Location: PACU  Anesthesia Type:General  Level of Consciousness: awake, alert  and oriented  Airway & Oxygen Therapy: Patient Spontanous Breathing  Post-op Assessment: Report given to PACU RN and Post -op Vital signs reviewed and stable  Post vital signs: Reviewed and stable  Complications: No apparent anesthesia complications

## 2012-08-28 NOTE — H&P (Signed)
[name] is an 22 y.o. female.   Chief Complaint: Severe epistaxis HPI: Recurrent epistaxis, previous packing and cautery  Past Medical History  Diagnosis Date  . Irregular heartbeat   . Cardiomyopathy   . Heart palpitations   . Facial angiofibroma   . Nosebleed     History reviewed. No pertinent past surgical history.  Family History  Problem Relation Age of Onset  . Heart disease Father     Unknown type   Social History:  reports that she has never smoked. She has never used smokeless tobacco. She reports that she does not drink alcohol or use illicit drugs.  Allergies: No Known Allergies  Medications Prior to Admission  Medication Sig Dispense Refill  . acetaminophen (TYLENOL) 500 MG tablet Take 500 mg by mouth daily as needed for pain.      Marland Kitchen amoxicillin-clavulanate (AUGMENTIN) 500-125 MG per tablet Take 1 tablet by mouth 3 (three) times daily.      Marland Kitchen oxymetazoline (AFRIN) 0.05 % nasal spray Place 2 sprays into the nose 2 (two) times daily.        No results found for this or any previous visit (from the past 48 hour(s)). No results found.  Review of Systems  Constitutional: Negative.   Respiratory: Negative.   Cardiovascular: Negative.   Gastrointestinal: Negative.   Neurological: Negative.     Last menstrual period 08/25/2012. Physical Exam  Constitutional: She is oriented to person, place, and time. She appears well-developed and well-nourished.  HENT:  Left nasal packing, active anterior/posterior bleeding  Neck: Normal range of motion. Neck supple.  Cardiovascular: Normal rate and regular rhythm.   Respiratory: Effort normal.  GI: Soft.  Musculoskeletal: Normal range of motion.  Neurological: She is alert and oriented to person, place, and time.     Assessment/Plan Adm for OP endoscopic nasal cautery  [name] 08/28/2012, 4:19 PM

## 2012-08-28 NOTE — Preoperative (Signed)
Beta Blockers   Reason not to administer Beta Blockers:Not Applicable 

## 2012-08-29 NOTE — Op Note (Signed)
[name], [name]          ACCOUNT NO.:  [id number]  MEDICAL RECORD NO.:  [id number]  LOCATION:  [facility]                         FACILITY:  [facility]  PHYSICIAN:  Kinnie Scales. Annalee Genta, M.D.DATE OF BIRTH:  August 09, 1990  DATE OF PROCEDURE:  08/28/2012 DATE OF DISCHARGE:  08/28/2012                              OPERATIVE REPORT   PREOPERATIVE DIAGNOSES:  Severe recurrent epistaxis.  POSTOPERATIVE DIAGNOSIS:  Severe recurrent epistaxis.  INDICATIONS FOR SURGERY:  Severe recurrent epistaxis.  PROCEDURE:  Endoscopic nasal cautery.  ANESTHESIA:  General endotracheal.  COMPLICATIONS:  None.  ESTIMATED BLOOD LOSS:  Less than 50 mL.  SURGEON:  Kinnie Scales. Annalee Genta, MD  The patient transferred from the operating room to the recovery room in stable condition.  BRIEF HISTORY:  She is a 22 year old female who was referred to our office for evaluation by the [facility] Emergency Department. She had a 2-week history of recurrent severe left anterior epistaxis, initially evaluated in the ER with cautery and packing.  She was followed up by Dr. Pollyann Kennedy.  On removing the nasal packing, she had profuse left nasal epistaxis requiring cautery in the office and repacking.  On subsequent packing removal, the patient was stable. After approximately 5 days, she developed recurrent epistaxis and returned to the Emergency Department at [facility], where her nose was repacked with a Merocel gauze pack.  Despite appropriate therapy and packing, the patient continued to have intermittent bleeding and was evaluated in our office on August 28, 2012.  No prior history of bleeding abnormality, bruising, frequent bleeding, or heavy menstruation.  No family history of bleeding abnormality.  No history of nasal trauma or infection.  Given the patient's history, examination, and findings, I recommended that we undertake endoscopic nasal examination and cautery of bleeding site under general  anesthesia in the hospital.  The risks and benefits of the procedure were discussed in detail with the patient and her father and they understood and concurred with our plan for surgery which is scheduled on an emergency basis at [facility] on August 28, 2012.  PROCEDURE IN DETAIL:  The patient was brought to the operating room, placed in supine position on the operating table.  General endotracheal anesthesia was established without difficulty.  When the patient was adequately anesthetized, she was positioned on the operating table, and prepped and draped in a sterile fashion.  The packing was then removed from the left nasal passageway.  Immediately, there was arterial bleeding from the floor of the nose along the mid aspect of the nasal septum at the patient's previous cautery site.  The area was carefully debrided and there was moderate amount of granulation tissue and our small arterial blood vessel was identified and this was cauterized with monopolar suction cautery.  The patient's nose is irrigated and suctioned.  0 degree nasal endoscopy was then performed from anterior to posterior and the nasal cavity was thoroughly examined.  The patient had a significantly deviated nasal septum with partial airway obstruction. No other obvious bleeding source was noted.  There are no masses or polyps and no evidence of active infection.  Attention was then returned to the left septal bleeding source, this was re-cauterized with  suction cautery and there was no additional bleeding.  Interrupted sutures consisting of 4-0 chromic were then placed in the lateral nasal passageway along the floor of the nose at the anterior aspect of the left inferior turbinate to reduce blood flow to the anterior septum on the left hand side.  The cautery site was then treated with Bactroban cream.  The nasal cavity was gently suctioned.  There was no further bleeding.  The oral cavity was examined and an  orogastric tube was passed.  Stomach contents were aspirated.  Her nose was not packed.  The patient was then awakened from anesthetic, extubated, and transferred from the operating room to recovery room in stable condition.  There were no complications.  The blood loss was less than 50 mL.          ______________________________ Kinnie Scales. Annalee Genta, M.D.     DLS/MEDQ  D:  16/10/9602  T:  08/29/2012  Job:  [id number]

## 2012-08-30 ENCOUNTER — Encounter (HOSPITAL_COMMUNITY): Payer: Self-pay | Admitting: Otolaryngology

## 2013-03-11 ENCOUNTER — Ambulatory Visit (INDEPENDENT_AMBULATORY_CARE_PROVIDER_SITE_OTHER): Payer: Managed Care, Other (non HMO) | Admitting: Physician Assistant

## 2013-03-11 VITALS — BP 140/90 | HR 107 | Temp 98.9°F | Resp 16 | Ht 63.0 in | Wt 127.4 lb

## 2013-03-11 DIAGNOSIS — IMO0002 Reserved for concepts with insufficient information to code with codable children: Secondary | ICD-10-CM

## 2013-03-11 DIAGNOSIS — S3023XA Contusion of vagina and vulva, initial encounter: Secondary | ICD-10-CM

## 2013-03-11 DIAGNOSIS — R002 Palpitations: Secondary | ICD-10-CM

## 2013-03-11 MED ORDER — METOPROLOL SUCCINATE ER 25 MG PO TB24: 25.0000 mg | ORAL_TABLET | Freq: Every day | ORAL | Status: DC

## 2013-03-11 NOTE — Progress Notes (Signed)
Subjective:    Patient ID: [name], female    DOB: 1990/05/19, 23 y.o.   MRN: [id number]  PCP: Kirk Ruths, MD  Chief Complaint  Patient presents with  . Pelvic Pain    [address]  on a piece of metal and had  a little  vaginal bleeding. Want to make sure she is still virgin.    Medications, allergies, past medical history, surgical history, family history, social history and problem list reviewed and updated.  HPI  Presents for evaluation of a vaginal injury, to make sure that she is still a virgin.  She reports that she fell yesterday on a "regular" piece of metal attached to the wall.  She will not provide further explanation for how the injury occurred. She reports having a very small amount of bleeding and some tenderness on the LEFT side.  She tells me that there is no hymenal injury.  She has never had intercourse.  Additionally, she asks for a refill of metoprolol.  She took it previously for treatment of heart palpitations. Stopped metoprolol in 11/2012, on the recommendation of her cardiologist, due to apparent resolution of her symptoms.  Since stopping it, she's experienced heart palpitations, SOB, suicidality and would like to restart it. She denies having thoughts of self harm or SOB previously, and believes that her symptoms are due to discontinuation of the medication.  She enjoys her work, her coworkers. She denies intention and plan for self harm.  She is accompanied by her mother.  Review of Systems As above. No CP, dizziness. No HA.    Objective:   Physical Exam  Vitals reviewed. Constitutional: She is oriented to person, place, and time. She appears well-developed. She is active and cooperative. No distress.  Eyes: Conjunctivae are normal.  Cardiovascular: Regular rhythm and normal heart sounds.   Pulmonary/Chest: Effort normal.  Genitourinary: Pelvic exam was performed with patient supine. No labial fusion. There is no rash, tenderness, lesion or  injury on the right labia. There is tenderness (labia majorum, mild) on the left labia. There is no rash, lesion or injury on the left labia.  She is extremely uncomfortable, even with external examination.  She withdraws from palpation of the LEFT labia while in lithotomy position, indicating moderate to severe tenderness.  However, when she sits up and uses her own finger to locate the discomfort and then I re-examine, she describes the pain as "only a little."  Neurological: She is alert and oriented to person, place, and time.  Skin: Skin is warm and dry. Lesion (papulo-pustular acne noted on the face) and rash (excoriated, on both legs) noted. Rash is papular (excoriated).  Dark hair growth noted on the face, arms, legs and perineum.  She hair has been shaved, and there are excoriated papules (razor burn?) on both legs.  Psychiatric: Her speech is normal. Her mood appears anxious. Her affect is not angry. She is not agitated, not aggressive, not hyperactive, not slowed, not withdrawn, not actively hallucinating and not combative. She exhibits a depressed mood. She expresses no homicidal and no suicidal ideation. She expresses no suicidal plans and no homicidal plans.  She was so uncomfortable during the external exam that she hopped off the table to dress before I lowered the table or moved out of the way, or indicated in any way that the examination was complete. She is attentive.   BP 140/90  Pulse 107  Temp(Src) 98.9 F (37.2 C) (Oral)  Resp 16  Ht 5'  3" (1.6 m)  Wt 127 lb 6.4 oz (57.788 kg)  BMI 22.57 kg/m2  SpO2 98%  LMP 01/29/2013        Assessment & Plan:  1. Contusion, vagina While the history provided is vague, the external vaginal area is normal in appearance without evidence of trauma of any kind.  She is assured that without having had sex, she remains a virgin. A letter to that effect is provided to give to her father.  2. Palpitations Her heart exam in normal today, and  she is advised that discontinuing the metoprolol would not typically cause the SOB or depression.  She's encouraged to schedule a follow-up with Dr. Stanford Breed, especially if her symptoms persist, as she may need other treatment. I would make an effort to see her alone to ask more detailed history that she may be unwilling to share with her mother. - metoprolol succinate (TOPROL-XL) 25 MG 24 hr tablet; Take 1 tablet (25 mg total) by mouth daily.  Dispense: 90 tablet; Refill: 1   Fara Chute, PA-C Physician Assistant-Certified [facility]

## 2013-03-11 NOTE — Patient Instructions (Signed)
Please contact Dr. Jacalyn Lefevre office to schedule a follow-up visit. It is unusual for feelings of depression to occur when a person stops taking metoprolol.  So, it's very important that you follow-up on this issue if it continues once you restart the metoprolol.

## 2013-04-07 ENCOUNTER — Ambulatory Visit (INDEPENDENT_AMBULATORY_CARE_PROVIDER_SITE_OTHER): Payer: Managed Care, Other (non HMO) | Admitting: Internal Medicine

## 2013-04-07 VITALS — BP 132/74 | HR 102 | Temp 98.4°F | Resp 18 | Ht 63.75 in | Wt 128.2 lb

## 2013-04-07 DIAGNOSIS — F411 Generalized anxiety disorder: Secondary | ICD-10-CM

## 2013-04-07 DIAGNOSIS — L719 Rosacea, unspecified: Secondary | ICD-10-CM

## 2013-04-07 DIAGNOSIS — G4700 Insomnia, unspecified: Secondary | ICD-10-CM

## 2013-04-07 DIAGNOSIS — R55 Syncope and collapse: Secondary | ICD-10-CM

## 2013-04-07 HISTORY — DX: Rosacea, unspecified: L71.9

## 2013-04-07 HISTORY — DX: Insomnia, unspecified: G47.00

## 2013-04-07 MED ORDER — CLONAZEPAM 0.5 MG PO TABS: 0.5000 mg | ORAL_TABLET | Freq: Every day | ORAL | Status: DC

## 2013-04-07 MED ORDER — DOXYCYCLINE HYCLATE 100 MG PO TABS: 100.0000 mg | ORAL_TABLET | Freq: Two times a day (BID) | ORAL | Status: DC

## 2013-04-07 NOTE — Progress Notes (Signed)
This chart was scribed for Tami Lin, MD by Einar Pheasant, ED Scribe. This patient was seen in room 10 and the patient's care was started at 7:03 PM. Subjective:    Patient ID: [name], female    DOB: 1990/05/07, 23 y.o.   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 female who's family is from [place] presents to the [facility] complaining of dizziness that started today at work. 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 She states that she was at work feeding a resident when she passed out. She states that she Lost consciousness for a couple of minutes. She was sitting up and had put her head down on the desk. She did not have palpitations or dizziness prior to this but was instead very tired. She was anxious about having to care for a patient that was too heavy for her to lift. Pt states that she has a history of palpitations and is currently taking Toprol to regulate it. Pt states that she was tired upon waking and is complaining of associated sleep disturbances secondary to anxiety. She states that she's been going through family issues, she states that they are very strict with her. She is often extremely anxious at home. Her anxiety prevents good sleep. Last night she had very little sleep due to being upset. She has significant trust issues which she does not want to discuss. She does have a boyfriend of some sort but has never been sexually active and cannot let her parents know anything about this because they would disapprove and puncture. She has 6 brothers and one sister who is in college at [employer].  Denies any fever, chills, diaphoresis, abdominal pain, chest pain, or emesis.  LNMP March 26, 2013. Pt is not sexually active. At times her anxiety makes her feel moody and almost depressed but she's not suicidal. She does  not want to talk to anyone. She certainly can talk to anyone within the [place]  community because they will not hold secrets.  Patient Active Problem List   Diagnosis Date Noted   Epistaxis--- resolved by surgery /no pathology in chart to rule out angiofibroma of the nasal passageway  08/28/2012   Palpitations 06/23/2011   Cardiomyopathy--subsequently ruled out by repeat echo /lower ejection fraction felt secondary to SVT  06/09/2010   Past Medical History  Diagnosis Date   Irregular heartbeat    Cardiomyopathy    Heart palpitations    Facial angiofibroma    Nosebleed    Past Surgical History  Procedure Laterality Date   Nasal hemorrhage control Bilateral 08/28/2012    Procedure: EPISTAXIS CONTROL;  Surgeon: Jerrell Belfast, MD;  Location: [facility];  Service: ENT;  Laterality: Bilateral;   No Known Allergies Prior to Admission medications   Medication Sig Start Date End Date Taking? Authorizing Provider  metoprolol succinate (TOPROL-XL) 25 MG 24 hr tablet Take 1 tablet (25 mg total) by mouth daily. 03/11/13  Yes Chelle Janalee Dane, PA-C   History   Social History   Marital Status: Single    Spouse Name: N/A    Number of Children: 0   Years of Education: N/A   Occupational History    [employer]   Social History Main  Topics   Smoking status: Never Smoker    Smokeless tobacco: Never Used   Alcohol Use: No   Drug Use: No   Sexual Activity: Not on file   Other Topics Concern   Not on file   Social History Narrative   ** Merged History Encounter **      Lives with both parents.  Family from [place].         Review of Systems As per HIP and PMH    Objective:   Physical Exam  Nursing note and vitals reviewed. Constitutional: She is oriented to person, place, and time. She appears well-developed and well-nourished. No distress.  HENT:  Head: Normocephalic and atraumatic.  Right Ear: External ear normal.  Left  Ear: External ear normal.  Nose: Nose normal.  Mouth/Throat: Oropharynx is clear and moist. No oropharyngeal exudate.  Eyes: Conjunctivae and EOM are normal. Pupils are equal, round, and reactive to light. Right eye exhibits no discharge. Left eye exhibits no discharge. Right eye exhibits no nystagmus. Left eye exhibits no nystagmus.  Neck: Neck supple. No thyromegaly present.  Cardiovascular: Normal rate, regular rhythm and normal heart sounds.  Exam reveals no gallop and no friction rub.   No murmur heard. Pulmonary/Chest: Effort normal and breath sounds normal. No respiratory distress. She has no decreased breath sounds. She has no wheezes. She has no rhonchi. She has no rales.  Abdominal: Soft. She exhibits no distension and no mass. There is no tenderness. There is no guarding.  Musculoskeletal: She exhibits no edema and no tenderness.  Lymphadenopathy:    She has no cervical adenopathy.  Neurological: She is alert and oriented to person, place, and time. She has normal reflexes. No cranial nerve deficit. Coordination normal.  Skin: Skin is warm and dry.  There is pustular rash over the forehead and malar area with underlining erythema. Several of the lesions have a pearly appearance as well  Psychiatric: She has a normal mood and affect. Her behavior is normal. Thought content normal.     Filed Vitals:   04/07/13 1854  BP: 132/74  Pulse: 102  Temp: 98.4 F (36.9 C)  TempSrc: Oral  Resp: 18  Height: 5' 3.75" (1.619 m)  Weight: 128 lb 3.2 oz (58.151 kg)  SpO2: 98%        Assessment & Plan:  Syncope-this is really more of a zoning out rather than true syncope it is very likely due to anxiety  Insomnia secondary to anxiety--- she would like to try medication to sleep Meds ordered this encounter  Medications   clonazePAM (KLONOPIN) 0.5 MG tablet    Sig: Take 1-2 tablets (0.5-1 mg total) by mouth at bedtime.    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 After ten days continue one daily and refill twice more with #30    Dispense:  40 tablet    Refill:  2   Generalized anxiety disorder--- we discussed therapy approaches with medication and counseling   I have completed the patient encounter in its entirety as documented by the scribe, with editing by me where necessary. Robert P. Laney Pastor, M.D.  She is to  follow up in one month

## 2014-06-05 ENCOUNTER — Emergency Department (INDEPENDENT_AMBULATORY_CARE_PROVIDER_SITE_OTHER)
Admission: EM | Admit: 2014-06-05 | Discharge: 2014-06-05 | Disposition: A | Discharge status: Home / Self Care | Payer: BLUE CROSS/BLUE SHIELD | Source: Home / Self Care | Attending: Family Medicine | Admitting: Family Medicine

## 2014-06-05 ENCOUNTER — Encounter (HOSPITAL_COMMUNITY): Payer: Self-pay | Admitting: *Deleted

## 2014-06-05 DIAGNOSIS — R0789 Other chest pain: Secondary | ICD-10-CM | POA: Diagnosis not present

## 2014-06-05 NOTE — ED Notes (Signed)
Pt  Reports  Chest  Pain         Started  Today  At  Work    Worse  When  She  Takes  A  Deep breath      Cap  reifll is  Intact           History  Of  arrythmyia   In  Past            denys  Any  Recent  Air  Travel       denys  Any       bcp      She  Is  Sitting  Upright on  Exam table    Speaking in  Complete  sentances

## 2014-06-05 NOTE — Discharge Instructions (Signed)
Use heat and ibuprofen or tylenol for soreness as needed, ok to work as scheduled.

## 2014-06-05 NOTE — ED Provider Notes (Signed)
CSN: [id number]     Arrival date & time 06/05/14  0917 History   First MD Initiated Contact with Patient 06/05/14 [phone]     Chief Complaint  Patient presents with  . Chest Pain   (Consider location/radiation/quality/duration/timing/severity/associated sxs/prior Treatment) Patient is a 24 y.o. female presenting with chest pain. The history is provided by the patient.  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 Associated symptoms: no cough, no palpitations and no shortness of breath     Past Medical History  Diagnosis Date  . Irregular heartbeat   . Cardiomyopathy   . Heart palpitations   . Facial angiofibroma   . Nosebleed    Past Surgical History  Procedure Laterality Date  . Nasal hemorrhage control Bilateral 08/28/2012    Procedure: EPISTAXIS CONTROL;  Surgeon: Jerrell Belfast, MD;  Location: [facility] OR;  Service: ENT;  Laterality: Bilateral;   Family History  Problem Relation Age of Onset  . Heart disease Father     Unknown type   History  Substance Use Topics  . Smoking status: Never Smoker   . Smokeless tobacco: Never Used  . Alcohol Use: No   OB History    No data available     Review of Systems  Constitutional: Negative.   HENT: Negative.   Respiratory: Negative for cough, shortness of breath and wheezing.   Cardiovascular: Positive for chest pain. Negative for palpitations and leg swelling.  Gastrointestinal: Negative.     Allergies  Review of patient's allergies indicates no known allergies.  Home Medications   Prior to Admission medications   Medication Sig Start Date End Date Taking? Authorizing Provider  clonazePAM (KLONOPIN) 0.5 MG tablet Take 1-2 tablets  (0.5-1 mg total) by mouth at bedtime. 04/07/13   Leandrew Koyanagi, MD  doxycycline (VIBRA-TABS) 100 MG tablet Take 1 tablet (100 mg total) by mouth 2 (two) times daily. After ten days continue one daily and refill twice more with #30 04/07/13   Leandrew Koyanagi, MD  metoprolol succinate (TOPROL-XL) 25 MG 24 hr tablet Take 1 tablet (25 mg total) by mouth daily. 03/11/13   Chelle S Jeffery, PA-C   BP 131/89 mmHg  Pulse 94  Temp(Src) 99 F (37.2 C) (Oral)  Resp 16  SpO2 100%  LMP 05/12/2014 Physical Exam  Constitutional: She is oriented to person, place, and time. She appears well-developed and well-nourished.  HENT:  Mouth/Throat: Oropharynx is clear and moist.  Eyes: Conjunctivae and EOM are normal. Pupils are equal, round, and reactive to light.  Neck: Normal range of motion. Neck supple.  Cardiovascular: Normal rate, regular rhythm, normal heart sounds and intact distal pulses.   Pulmonary/Chest: Effort normal and breath sounds normal. She exhibits tenderness.    Abdominal: Soft. Bowel sounds are normal. There is no tenderness.  Neurological: She is alert and oriented to person, place, and time.  Skin: Skin is warm and dry.  Nursing note and vitals reviewed.   ED Course  Procedures (including critical care time) Labs Review Labs Reviewed - No data to display ecg--nsr, wnl. Imaging Review No results found.   MDM   1. Left-sided chest wall pain  Billy Fischer, MD 06/05/14 [phone]

## 2014-07-03 ENCOUNTER — Emergency Department (HOSPITAL_COMMUNITY)
Admission: EM | Admit: 2014-07-03 | Discharge: 2014-07-03 | Disposition: A | Discharge status: Home / Self Care | Payer: BLUE CROSS/BLUE SHIELD | Attending: Emergency Medicine | Admitting: Emergency Medicine

## 2014-07-03 ENCOUNTER — Encounter (HOSPITAL_COMMUNITY): Payer: Self-pay | Admitting: *Deleted

## 2014-07-03 DIAGNOSIS — Z79899 Other long term (current) drug therapy: Secondary | ICD-10-CM | POA: Insufficient documentation

## 2014-07-03 DIAGNOSIS — Z3202 Encounter for pregnancy test, result negative: Secondary | ICD-10-CM | POA: Insufficient documentation

## 2014-07-03 DIAGNOSIS — Z8679 Personal history of other diseases of the circulatory system: Secondary | ICD-10-CM | POA: Insufficient documentation

## 2014-07-03 DIAGNOSIS — R112 Nausea with vomiting, unspecified: Secondary | ICD-10-CM | POA: Diagnosis not present

## 2014-07-03 DIAGNOSIS — R42 Dizziness and giddiness: Secondary | ICD-10-CM | POA: Insufficient documentation

## 2014-07-03 LAB — BASIC METABOLIC PANEL
Anion gap: 9 (ref 5–15)
BUN: 16 mg/dL (ref 6–20)
CO2: 26 mmol/L (ref 22–32)
Calcium: 9.4 mg/dL (ref 8.9–10.3)
Chloride: 104 mmol/L (ref 101–111)
Creatinine, Ser: 0.59 mg/dL (ref 0.44–1.00)
GLUCOSE: 95 mg/dL (ref 65–99)
POTASSIUM: 4.2 mmol/L (ref 3.5–5.1)
Sodium: 139 mmol/L (ref 135–145)

## 2014-07-03 LAB — URINE MICROSCOPIC-ADD ON

## 2014-07-03 LAB — CBC
HCT: 42.2 % (ref 36.0–46.0)
Hemoglobin: 13.5 g/dL (ref 12.0–15.0)
MCH: 27.7 pg (ref 26.0–34.0)
MCHC: 32 g/dL (ref 30.0–36.0)
MCV: 86.7 fL (ref 78.0–100.0)
Platelets: 194 10*3/uL (ref 150–400)
RBC: 4.87 MIL/uL (ref 3.87–5.11)
RDW: 14.7 % (ref 11.5–15.5)
WBC: 11.5 10*3/uL — AB (ref 4.0–10.5)

## 2014-07-03 LAB — URINALYSIS, ROUTINE W REFLEX MICROSCOPIC
BILIRUBIN URINE: NEGATIVE
Glucose, UA: NEGATIVE mg/dL
KETONES UR: NEGATIVE mg/dL
Nitrite: NEGATIVE
Protein, ur: NEGATIVE mg/dL
Specific Gravity, Urine: 1.028 (ref 1.005–1.030)
Urobilinogen, UA: 0.2 mg/dL (ref 0.0–1.0)
pH: 5.5 (ref 5.0–8.0)

## 2014-07-03 LAB — POC URINE PREG, ED: Preg Test, Ur: NEGATIVE

## 2014-07-03 MED ORDER — SODIUM CHLORIDE 0.9 % IV BOLUS (SEPSIS)
1000.0000 mL | INTRAVENOUS | Status: AC
  Administered 2014-07-03: 1000 mL via INTRAVENOUS

## 2014-07-03 NOTE — ED Provider Notes (Signed)
CSN: [id number]     Arrival date & time 07/03/14  0728 History   First MD Initiated Contact with Patient 07/03/14 0800     Chief Complaint  Patient presents with  . Dizziness     (Consider location/radiation/quality/duration/timing/severity/associated sxs/prior Treatment) Patient is a 24 y.o. female presenting with dizziness. The history is provided by the patient.  Dizziness Quality:  Imbalance Severity:  Mild Onset quality:  Gradual Duration:  2 hours Timing:  Constant Progression:  Unchanged Chronicity:  New Context: standing up   Relieved by:  Nothing Worsened by:  Standing up Ineffective treatments:  None tried Associated symptoms: nausea and vomiting   Associated symptoms: no chest pain, no diarrhea, no headaches and no shortness of breath     Past Medical History  Diagnosis Date  . Irregular heartbeat   . Cardiomyopathy   . Heart palpitations   . Facial angiofibroma   . Nosebleed    Past Surgical History  Procedure Laterality Date  . Nasal hemorrhage control Bilateral 08/28/2012    Procedure: EPISTAXIS CONTROL;  Surgeon: Jerrell Belfast, MD;  Location: [facility] OR;  Service: ENT;  Laterality: Bilateral;   Family History  Problem Relation Age of Onset  . Heart disease Father     Unknown type   History  Substance Use Topics  . Smoking status: Never Smoker   . Smokeless tobacco: Never Used  . Alcohol Use: No   OB History    No data available     Review of Systems  Constitutional: Negative for fever and fatigue.  HENT: Negative for congestion and drooling.   Eyes: Negative for pain.  Respiratory: Negative for cough and shortness of breath.   Cardiovascular: Negative for chest pain.  Gastrointestinal: Positive for nausea and vomiting. Negative for abdominal pain and diarrhea.  Genitourinary: Negative for dysuria and hematuria.  Musculoskeletal: Negative for back pain, gait problem and neck pain.  Skin: Negative for color change.  Neurological: Positive for  dizziness. Negative for headaches.  Hematological: Negative for adenopathy.  Psychiatric/Behavioral: Negative for behavioral problems.  All other systems reviewed and are negative.     Allergies  Review of patient's allergies indicates no known allergies.  Home Medications   Prior to Admission medications   Medication Sig Start Date End Date Taking? Authorizing Provider  clonazePAM (KLONOPIN) 0.5 MG tablet Take 1-2 tablets (0.5-1 mg total) by mouth at bedtime. 04/07/13   Leandrew Koyanagi, MD  doxycycline (VIBRA-TABS) 100 MG tablet Take 1 tablet (100 mg total) by mouth 2 (two) times daily. After ten days continue one daily and refill twice more with #30 04/07/13   Leandrew Koyanagi, MD  metoprolol succinate (TOPROL-XL) 25 MG 24 hr tablet Take 1 tablet (25 mg total) by mouth daily. 03/11/13   Chelle Jeffery, PA-C   BP 120/81 mmHg  Pulse 97  Temp(Src) 98 F (36.7 C) (Oral)  Resp 17  SpO2 100%  LMP 05/12/2014 Physical Exam  Constitutional: She is oriented to person, place, and time. She appears well-developed and well-nourished.  HENT:  Head: Normocephalic.  Mouth/Throat: Oropharynx is clear and moist. No oropharyngeal exudate.  Eyes: Conjunctivae and EOM are normal. Pupils are equal, round, and reactive to light.  Neck: Normal range of motion. Neck supple.  Cardiovascular: Normal rate, regular rhythm, normal heart sounds and intact distal pulses.  Exam reveals no gallop and no friction rub.   No murmur heard. Pulmonary/Chest: Effort normal and breath sounds normal. No respiratory distress. She has no wheezes.  Abdominal:  Soft. Bowel sounds are normal. There is no tenderness. There is no rebound and no guarding.  Musculoskeletal: Normal range of motion. She exhibits no edema or tenderness.  Neurological: She is alert and oriented to person, place, and time.  alert, oriented x3 speech: normal in context and clarity memory: intact grossly cranial nerves II-XII: intact motor  strength: full proximally and distally no involuntary movements or tremors sensation: intact to light touch diffusely  cerebellar: finger-to-nose and heel-to-shin intact gait: dizziness reproduced w/ standing, mildly ataxic on initial evaluation  Skin: Skin is warm and dry.  Psychiatric: She has a normal mood and affect. Her behavior is normal.  Nursing note and vitals reviewed.   ED Course  Procedures (including critical care time) Labs Review Labs Reviewed  URINALYSIS, ROUTINE W REFLEX MICROSCOPIC (NOT AT [facility]) - Abnormal; Notable for the following:    APPearance CLOUDY (*)    Hgb urine dipstick MODERATE (*)    Leukocytes, UA SMALL (*)    All other components within normal limits  CBC - Abnormal; Notable for the following:    WBC 11.5 (*)    All other components within normal limits  URINE MICROSCOPIC-ADD ON - Abnormal; Notable for the following:    Squamous Epithelial / LPF MANY (*)    Bacteria, UA MANY (*)    Crystals CA OXALATE CRYSTALS (*)    All other components within normal limits  URINE CULTURE  BASIC METABOLIC PANEL  POC URINE PREG, ED    Imaging Review No results found.   EKG Interpretation None      MDM   Final diagnoses:  Dizziness    8:17 AM 24 y.o. female who presents with dizziness which began upon awakening this morning around 2 hours ago. She states that she is under a lot of stress and did not sleep well last night. She states that she feels a sense of imbalance when walking but feels okay at rest. She did have several episodes of emesis this morning but denies any nausea currently. Vital signs unremarkable here. She does have some difficulty ambulating on exam but neurologic exam is otherwise unremarkable. We'll get screening labs and IV fluids and reassess.  10:18 AM: I interpreted/reviewed the labs and/or imaging which were non-contributory. Urinalysis shows many bacteria but also many epithelial cells. She denies any urinary symptoms. We'll send  culture and not treat with antibiotics. Her gait has improved with IV fluid hydration. She can now walk forwards and backwards without difficulty. Unlikely that this is a serious cause of dizziness such as stroke or intracranial abnormality. We'll recommend continued oral hydration at home and return for any worsening. I have discussed the diagnosis/risks/treatment options with the patient and believe the pt to be eligible for discharge home to follow-up with her pcp as needed. We also discussed returning to the ED immediately if new or worsening sx occur. We discussed the sx which are most concerning (e.g., worsening dizziness, HA, fever) that necessitate immediate return. Medications administered to the patient during their visit and any new prescriptions provided to the patient are listed below.  Medications given during this visit Medications  sodium chloride 0.9 % bolus 1,000 mL (1,000 mLs Intravenous New Bag/Given 07/03/14 0846)    New Prescriptions   No medications on file      Pamella Pert, MD 07/03/14 1020

## 2014-07-03 NOTE — ED Notes (Signed)
Patient began having dizziness with standing and walking this morning.  Patient also states she feels tired.  Patient hasn't had anything to eat or drink since yesterday @ 1800.  Patient states she got 3 hours of sleep last night before going to work in a [place] home this morning.  Patient denies previous episodes of dizziness.

## 2014-07-03 NOTE — Discharge Instructions (Signed)

## 2014-07-04 LAB — URINE CULTURE
COLONY COUNT: NO GROWTH
CULTURE: NO GROWTH
SPECIAL REQUESTS: NORMAL

## 2015-02-28 ENCOUNTER — Emergency Department (HOSPITAL_COMMUNITY)
Admission: EM | Admit: 2015-02-28 | Discharge: 2015-02-28 | Disposition: A | Discharge status: Home / Self Care | Payer: BLUE CROSS/BLUE SHIELD | Attending: Emergency Medicine | Admitting: Emergency Medicine

## 2015-02-28 ENCOUNTER — Encounter (HOSPITAL_COMMUNITY): Payer: Self-pay

## 2015-02-28 DIAGNOSIS — Z8679 Personal history of other diseases of the circulatory system: Secondary | ICD-10-CM | POA: Insufficient documentation

## 2015-02-28 DIAGNOSIS — Z862 Personal history of diseases of the blood and blood-forming organs and certain disorders involving the immune mechanism: Secondary | ICD-10-CM | POA: Insufficient documentation

## 2015-02-28 DIAGNOSIS — Z3202 Encounter for pregnancy test, result negative: Secondary | ICD-10-CM | POA: Insufficient documentation

## 2015-02-28 DIAGNOSIS — N390 Urinary tract infection, site not specified: Secondary | ICD-10-CM | POA: Insufficient documentation

## 2015-02-28 LAB — URINE MICROSCOPIC-ADD ON

## 2015-02-28 LAB — URINALYSIS, ROUTINE W REFLEX MICROSCOPIC
Bilirubin Urine: NEGATIVE
Glucose, UA: NEGATIVE mg/dL
Ketones, ur: 15 mg/dL — AB
NITRITE: NEGATIVE
PH: 6.5 (ref 5.0–8.0)
Protein, ur: 100 mg/dL — AB
Specific Gravity, Urine: 1.029 (ref 1.005–1.030)

## 2015-02-28 LAB — PREGNANCY, URINE: Preg Test, Ur: NEGATIVE

## 2015-02-28 MED ORDER — SULFAMETHOXAZOLE-TRIMETHOPRIM 800-160 MG PO TABS: 1.0000 | ORAL_TABLET | Freq: Two times a day (BID) | ORAL | Status: AC

## 2015-02-28 MED ORDER — PHENAZOPYRIDINE HCL 200 MG PO TABS: 200.0000 mg | ORAL_TABLET | Freq: Three times a day (TID) | ORAL | Status: DC

## 2015-02-28 NOTE — ED Notes (Signed)
Pt c/o dysuria and decreased urinary output starting this morning.  Denies pain.

## 2015-02-28 NOTE — Discharge Instructions (Signed)

## 2015-02-28 NOTE — ED Provider Notes (Signed)
CSN: [id number]     Arrival date & time 02/28/15  [id number] History   First MD Initiated Contact with Patient 02/28/15 0800     Chief Complaint  Patient presents with  . Dysuria     (Consider location/radiation/quality/duration/timing/severity/associated sxs/prior Treatment) HPI Patient reports that she started having burning with urination this morning. She had urgency to go but then only a small amount will come out. No associated abdominal pain or back pain. No associated fevers or chills. She does not have history of frequent UTI. Patient denies sexual activity. Past Medical History  Diagnosis Date  . Irregular heartbeat   . Cardiomyopathy   . Heart palpitations   . Facial angiofibroma ([place])   . Nosebleed    Past Surgical History  Procedure Laterality Date  . Nasal hemorrhage control Bilateral 08/28/2012    Procedure: EPISTAXIS CONTROL;  Surgeon: Jerrell Belfast, MD;  Location: [facility] OR;  Service: ENT;  Laterality: Bilateral;   Family History  Problem Relation Age of Onset  . Heart disease Father     Unknown type   Social History  Substance Use Topics  . Smoking status: Never Smoker   . Smokeless tobacco: Never Used  . Alcohol Use: No   OB History    No data available     Review of Systems  10 Systems reviewed and are negative for acute change except as noted in the HPI.   Allergies  Review of patient's allergies indicates no known allergies.  Home Medications   Prior to Admission medications   Medication Sig Start Date End Date Taking? Authorizing Provider  clonazePAM (KLONOPIN) 0.5 MG tablet Take 1-2 tablets (0.5-1 mg total) by mouth at bedtime. Patient not taking: Reported on 07/03/2014 04/07/13   Leandrew Koyanagi, MD  doxycycline (VIBRA-TABS) 100 MG tablet Take 1 tablet (100 mg total) by mouth 2 (two) times daily. After ten days continue one daily and refill twice more with #30 Patient not taking: Reported on 07/03/2014 04/07/13   Leandrew Koyanagi, MD  metoprolol  succinate (TOPROL-XL) 25 MG 24 hr tablet Take 1 tablet (25 mg total) by mouth daily. Patient not taking: Reported on 07/03/2014 03/11/13   Harrison Mons, PA-C  phenazopyridine (PYRIDIUM) 200 MG tablet Take 1 tablet (200 mg total) by mouth 3 (three) times daily. 02/28/15   Charlesetta Shanks, MD  sulfamethoxazole-trimethoprim (BACTRIM DS,SEPTRA DS) 800-160 MG tablet Take 1 tablet by mouth 2 (two) times daily. 02/28/15 03/07/15  Charlesetta Shanks, MD   BP 130/81 mmHg  Pulse 96  Temp(Src) 98 F (36.7 C) (Oral)  Resp 14  SpO2 100%  LMP 02/03/2015 Physical Exam  Constitutional: She is oriented to person, place, and time. She appears well-developed and well-nourished.  HENT:  Head: Normocephalic and atraumatic.  Eyes: EOM are normal. Pupils are equal, round, and reactive to light.  Neck: Neck supple.  Cardiovascular: Normal rate, regular rhythm, normal heart sounds and intact distal pulses.   Pulmonary/Chest: Effort normal and breath sounds normal.  Abdominal: Soft. Bowel sounds are normal. She exhibits no distension. There is no tenderness.  No CVA tenderness  Musculoskeletal: Normal range of motion. She exhibits no edema.  Neurological: She is alert and oriented to person, place, and time. She has normal strength. Coordination normal. GCS eye subscore is 4. GCS verbal subscore is 5. GCS motor subscore is 6.  Skin: Skin is warm, dry and intact.  Psychiatric: She has a normal mood and affect.    ED Course  Procedures (including critical care  time) Labs Review Labs Reviewed  URINALYSIS, ROUTINE W REFLEX MICROSCOPIC (NOT AT [facility]) - Abnormal; Notable for the following:    Color, Urine AMBER (*)    APPearance CLOUDY (*)    Hgb urine dipstick MODERATE (*)    Ketones, ur 15 (*)    Protein, ur 100 (*)    Leukocytes, UA MODERATE (*)    All other components within normal limits  URINE MICROSCOPIC-ADD ON - Abnormal; Notable for the following:    Squamous Epithelial / LPF 0-5 (*)    Bacteria, UA MANY (*)     All other components within normal limits  URINE CULTURE  PREGNANCY, URINE    Imaging Review No results found. I have personally reviewed and evaluated these images and lab results as part of my medical decision-making.   EKG Interpretation None      MDM   Final diagnoses:  UTI (lower urinary tract infection)   Patient presents with classic UTI symptoms and positive UA. Patient is not sexually active. At this time she'll be treated with Pyridium and Bactrim. Her turn instructions are provided.   Charlesetta Shanks, MD 02/28/15 [phone]

## 2015-03-02 LAB — URINE CULTURE: Culture: >=100000

## 2015-03-03 ENCOUNTER — Telehealth (HOSPITAL_BASED_OUTPATIENT_CLINIC_OR_DEPARTMENT_OTHER): Payer: Self-pay | Admitting: Emergency Medicine

## 2015-03-03 NOTE — Telephone Encounter (Signed)
Post ED Visit - Positive Culture Follow-up  Culture report reviewed by antimicrobial stewardship pharmacist:  []  Elenor Quinones, Pharm.D. []  Heide Guile, Pharm.D., BCPS []  Parks Neptune, Pharm.D. [x]  Alycia Rossetti, Pharm.D., BCPS []  [place], Pharm.D., BCPS, AAHIVP []  Legrand Como, Pharm.D., BCPS, AAHIVP []  Milus Glazier, Pharm.D. []  Stephens November, Pharm.D.  Positive urine culture Staph Treated with bactrim DS, organism sensitive to the same and no further patient follow-up is required at this time.  Hazle Nordmann 03/03/2015, 10:51 AM

## 2015-12-03 ENCOUNTER — Encounter (INDEPENDENT_AMBULATORY_CARE_PROVIDER_SITE_OTHER): Payer: PRIVATE HEALTH INSURANCE

## 2015-12-03 ENCOUNTER — Ambulatory Visit (INDEPENDENT_AMBULATORY_CARE_PROVIDER_SITE_OTHER): Payer: PRIVATE HEALTH INSURANCE | Admitting: Physician Assistant

## 2015-12-03 ENCOUNTER — Encounter: Payer: Self-pay | Admitting: Physician Assistant

## 2015-12-03 VITALS — BP 115/82 | HR 76 | Ht 63.75 in | Wt 134.6 lb

## 2015-12-03 DIAGNOSIS — R002 Palpitations: Secondary | ICD-10-CM

## 2015-12-03 DIAGNOSIS — I471 Supraventricular tachycardia: Secondary | ICD-10-CM | POA: Diagnosis not present

## 2015-12-03 LAB — TSH: TSH: 1.57 mIU/L

## 2015-12-03 MED ORDER — MELATONIN 3 MG PO CAPS
1.0000 | ORAL_CAPSULE | Freq: Every evening | ORAL | 0 refills | Status: DC | PRN
Start: 2015-12-03 — End: 2021-11-29

## 2015-12-03 MED ORDER — METOPROLOL SUCCINATE ER 25 MG PO TB24: 25.0000 mg | ORAL_TABLET | Freq: Every day | ORAL | 6 refills | Status: DC

## 2015-12-03 MED ORDER — METOPROLOL TARTRATE 25 MG PO TABS: 25.0000 mg | ORAL_TABLET | Freq: Every day | ORAL | 6 refills | Status: DC

## 2015-12-03 NOTE — Progress Notes (Signed)
Cardiology Office Note   Date:  12/03/2015   ID:  [name], DOB Oct 30, 1990, MRN [id number]  PCP:  Kirk Ruths, MD  Cardiologist:  Dr Stanford Breed 01/2012 Rosaria Ferries, PA-C   Chief Complaint  Patient presents with  . Palpitations    skipping  . Chest Pain    History of Present Illness: [name] is a 25 y.o. female with a history of PACs and PAT w/ nl TSH and electrolytes 2012. ECHO then w/ EF 40-45%>>55-60% by 12/2010. Pt was to continue BB at last ofc visit 2014  [name] presents for evaluation of palpitations.She is here today with her father.  When previously seen by Dr. Stanford Breed, her palpitations improved on the low dose of metoprolol. However, she is no longer taking this. In the last few months, the palpitations started back again. They occur at rest and bother her great deal. Sometimes they make her a little uncomfortable. She is not having long runs of palpitations, but is having them very frequently sometimes.  She has not had presyncope or been lightheaded with them. She has no history of exertional chest pain. She gets a little uncomfortable sometimes when the palpitations are very frequent, but her symptoms resolve as soon as the palpitations decrease.  She has not taken any over-the-counter cold medications or other drugs except for melatonin 3 mg tablets to help her sleep. She drinks a cup of coffee or soda daily. She stopped all caffeine when the palpitations first started back, but had a cup of coffee this morning. She does not feel like her palpitations increased significantly after her morning coffee.  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 She was reading about her ECG in her textbook, and it said that her ECG was abnormal. She is concerned about this. She has a tracing from when she was having palpitations and EMS was called out. It is reviewed.  She wants to make sure that the palpitations  are benign, that there is no treatable cause of them, and that they are not harming her.   Past Medical History:  Diagnosis Date  . Cardiomyopathy   . Facial angiofibroma   . Heart palpitations   . Irregular heartbeat   . Nosebleed     Past Surgical History:  Procedure Laterality Date  . NASAL HEMORRHAGE CONTROL Bilateral 08/28/2012   Procedure: EPISTAXIS CONTROL;  Surgeon: Jerrell Belfast, MD;  Location: [facility];  Service: ENT;  Laterality: Bilateral;    Current Outpatient Prescriptions  Medication Sig Dispense Refill  . clonazePAM (KLONOPIN) 0.5 MG tablet Take 1-2 tablets (0.5-1 mg total) by mouth at bedtime. (Patient not taking: Reported on 07/03/2014) 30 tablet 2  . Melatonin 3 mg QHS prn 40 tablet 2   No current facility-administered medications for this visit.     Allergies:   Review of patient's allergies indicates no known allergies.    Social History:  The patient  reports that she has never smoked. She has never used smokeless tobacco. She reports that she does not drink alcohol or use drugs.   Family History:  The patient's family history includes Heart disease in her father.    ROS:  Please see the history of present illness. All other systems are reviewed and negative.    PHYSICAL EXAM: VS:  BP 115/82   Pulse 76   Ht 5' 3.75" (1.619 m)   Wt 134 lb 9.6 oz (61.1 kg)  BMI 23.29 kg/m  , BMI Body mass index is 23.29 kg/m. GEN: Well nourished, well developed, female in no acute distress  HEENT: normal for age  Neck: no JVD, no carotid bruit, no masses Cardiac: RRR; no murmur, no rubs, or gallops Respiratory:  clear to auscultation bilaterally, normal work of breathing GI: soft, nontender, nondistended, + BS MS: no deformity or atrophy; no edema; distal pulses are 2+ in all 4 extremities   Skin: warm and dry, no rash Neuro:  Strength and sensation are intact Psych: euthymic mood, full affect   EKG:  EKG is ordered today. The ekg ordered today demonstrates  sinus rhythm, heart rate 76, possible mild sinus arrhythmia, no acute ischemic changes, no delta wave. PR interval is 152 ms  EMS ECG is reviewed with her. She has small complexes in lead aVL and she was reassured that this did not have clinical significance. ECG is otherwise sinus rhythm with no abnormalities and her intervals.   Recent Labs: No results found for requested labs within last 8760 hours.    Lipid Panel No results found for: CHOL, TRIG, HDL, CHOLHDL, VLDL, LDLCALC, LDLDIRECT   Wt Readings from Last 3 Encounters:  12/03/15 134 lb 9.6 oz (61.1 kg)  04/07/13 128 lb 3.2 oz (58.2 kg)  03/11/13 127 lb 6.4 oz (57.8 kg)     Other studies Reviewed: Additional studies/ records that were reviewed today include: Previous office notes and testing and available data.  ASSESSMENT AND PLAN:  1.  Palpitations: While was checking her pulse, she skipped a couple of beats. I advised her that it was quite likely her palpitations are the same thing she was having before. Advised that we could restart her beta blocker at the previous dose and see how she tolerated it. I advised her that her blood pressure was normal and we would have to watch for hypotension, but she had tolerated it before and I thought it was a good idea to try this again.  We will check a TSH today, and get an event monitor. If the event monitor shows a significant arrhythmia burden, we can check an echocardiogram. However, her last echocardiogram was very normal and I have no evidence on physical exam or symptoms that there is anything abnormal about it. Further evaluation and treatment will depend on the response to reinitiation of a beta blocker and the results of the event monitor and TSH check.   Current medicines are reviewed at length with the patient today.  The patient does not have concerns regarding medicines.  The following changes have been made:  Restart Toprol-XL 25 mg daily  Labs/ tests ordered today  include:   Orders Placed This Encounter  Procedures  . TSH  . Cardiac event monitor  . EKG 12-Lead     Disposition:   FU with Dr. Stanford Breed  Signed, Rosaria Ferries, PA-C  12/03/2015 2:05 PM    [place] Phone: [phone]; Fax: [phone]  This note was written with the assistance of speech recognition software. Please excuse any transcriptional errors.

## 2015-12-03 NOTE — Patient Instructions (Addendum)
Medication Instructions:  RESTART Metoprolol 25mg  Take 1 tab by mouth once a day   Labwork: Your physician recommends that you return for lab work in: [place]  Testing/Procedures: Your physician has recommended that you wear an 30 event monitor. Event monitors are medical devices that record the heart's electrical activity. Doctors most often [place] these monitors to diagnose arrhythmias. Arrhythmias are problems with the speed or rhythm of the heartbeat. The monitor is a small, portable device. You can wear one while you do your normal daily activities. This is usually used to diagnose what is causing palpitations/syncope (passing out).  Follow-Up: Your physician recommends that you schedule a follow-up appointment in: 6-7 WEEKS WITH CRENSHAW   Any Other Special Instructions Will Be Listed Below (If Applicable).     If you need a refill on your cardiac medications before your next appointment, please call your pharmacy.

## 2015-12-06 ENCOUNTER — Ambulatory Visit: Payer: BLUE CROSS/BLUE SHIELD | Admitting: Physician Assistant

## 2015-12-07 ENCOUNTER — Ambulatory Visit: Payer: BLUE CROSS/BLUE SHIELD | Admitting: Physician Assistant

## 2016-01-17 ENCOUNTER — Encounter: Payer: Self-pay | Admitting: *Deleted

## 2016-01-17 ENCOUNTER — Ambulatory Visit: Payer: PRIVATE HEALTH INSURANCE | Admitting: Physician Assistant

## 2016-01-17 NOTE — Progress Notes (Deleted)
Cardiology Office Note   Date:  01/17/2016   ID:  Ruth, Butler October 23, 1990, MRN [id number]  PCP:  Kirk Ruths, MD  Cardiologist:  Dr Stanford Breed 01/2012  Rosaria Ferries, PA-C 12/03/2015  No chief complaint on file.   History of Present Illness: [name] is a 25 y.o. female with a history of  PACs and PAT w/ nl TSH and electrolytes 2012. ECHO then w/ EF 40-45%>>55-60% by 12/2010. Pt was to continue BB at last ofc visit 2014  11/03, seen for palpitations and no longer on BB. BB restarted and event monitor ordered.  [name] presents for ***   Past Medical History:  Diagnosis Date  . Cardiomyopathy   . Facial angiofibroma   . Heart palpitations   . Irregular heartbeat   . Nosebleed     Past Surgical History:  Procedure Laterality Date  . NASAL HEMORRHAGE CONTROL Bilateral 08/28/2012   Procedure: EPISTAXIS CONTROL;  Surgeon: Jerrell Belfast, MD;  Location: [facility];  Service: ENT;  Laterality: Bilateral;    Current Outpatient Prescriptions  Medication Sig Dispense Refill  . clonazePAM (KLONOPIN) 0.5 MG tablet Take 0.5 mg by mouth 2 (two) times daily as needed for anxiety.    . Melatonin 3 MG CAPS Take 1 capsule (3 mg total) by mouth at bedtime as needed.  0  . metoprolol succinate (TOPROL-XL) 25 MG 24 hr tablet Take 1 tablet (25 mg total) by mouth daily. 30 tablet 6   No current facility-administered medications for this visit.     Allergies:   Patient has no known allergies.    Social History:  The patient  reports that she has never smoked. She has never used smokeless tobacco. She reports that she does not drink alcohol or use drugs.   Family History:  The patient's family history includes Heart disease in her father.    ROS:  Please see the history of present illness. All other systems are reviewed and negative.    PHYSICAL EXAM: VS:  There were no vitals taken for this visit. , BMI There is no height or weight on file to calculate  BMI. GEN: Well nourished, well developed, female in no acute distress  HEENT: normal for age  Neck: no JVD, no carotid bruit, no masses Cardiac: RRR; no murmur, no rubs, or gallops Respiratory:  clear to auscultation bilaterally, normal work of breathing GI: soft, nontender, nondistended, + BS MS: no deformity or atrophy; no edema; distal pulses are 2+ in all 4 extremities   Skin: warm and dry, no rash Neuro:  Strength and sensation are intact Psych: euthymic mood, full affect   EKG:  EKG {ACTION; IS/IS [id number] ordered today. The ekg ordered today demonstrates ***   Recent Labs: 12/03/2015: TSH 1.57    Lipid Panel No results found for: CHOL, TRIG, HDL, CHOLHDL, VLDL, LDLCALC, LDLDIRECT   Wt Readings from Last 3 Encounters:  12/03/15 134 lb 9.6 oz (61.1 kg)  04/07/13 128 lb 3.2 oz (58.2 kg)  03/11/13 127 lb 6.4 oz (57.8 kg)     Other studies Reviewed: Additional studies/ records that were reviewed today include: ***.  ASSESSMENT AND PLAN:  1.  ***   Current medicines are reviewed at length with the patient today.  The patient {ACTIONS; HAS/DOES NOT HAVE:19233} concerns regarding medicines.  The following changes have been made:  {PLAN; NO CHANGE:13088:s}  Labs/ tests ordered today include: *** No orders of the defined types were placed in this encounter.  Disposition:   FU with ***  Signed, Rosaria Ferries, PA-C  01/17/2016 7:55 AM    [facility] Phone: [phone]; Fax: [phone]  This note was written with the assistance of speech recognition software. Please excuse any transcriptional errors.

## 2016-05-30 ENCOUNTER — Encounter (HOSPITAL_COMMUNITY): Payer: Self-pay | Admitting: Emergency Medicine

## 2016-05-30 ENCOUNTER — Emergency Department (HOSPITAL_COMMUNITY): Payer: Self-pay

## 2016-05-30 ENCOUNTER — Emergency Department (HOSPITAL_COMMUNITY)
Admission: EM | Admit: 2016-05-30 | Discharge: 2016-05-30 | Disposition: A | Discharge status: Home / Self Care | Payer: Self-pay | Attending: Emergency Medicine | Admitting: Emergency Medicine

## 2016-05-30 DIAGNOSIS — Z79899 Other long term (current) drug therapy: Secondary | ICD-10-CM | POA: Insufficient documentation

## 2016-05-30 DIAGNOSIS — N83209 Unspecified ovarian cyst, unspecified side: Secondary | ICD-10-CM

## 2016-05-30 DIAGNOSIS — N83299 Other ovarian cyst, unspecified side: Secondary | ICD-10-CM | POA: Insufficient documentation

## 2016-05-30 LAB — WET PREP, GENITAL
CLUE CELLS WET PREP: NONE SEEN
SPERM: NONE SEEN
TRICH WET PREP: NONE SEEN
YEAST WET PREP: NONE SEEN

## 2016-05-30 LAB — COMPREHENSIVE METABOLIC PANEL
ALK PHOS: 86 U/L (ref 38–126)
ALT: 14 U/L (ref 14–54)
ANION GAP: 7 (ref 5–15)
AST: 24 U/L (ref 15–41)
Albumin: 4.2 g/dL (ref 3.5–5.0)
BILIRUBIN TOTAL: 0.3 mg/dL (ref 0.3–1.2)
BUN: 8 mg/dL (ref 6–20)
CALCIUM: 9.4 mg/dL (ref 8.9–10.3)
CO2: 26 mmol/L (ref 22–32)
Chloride: 103 mmol/L (ref 101–111)
Creatinine, Ser: 0.56 mg/dL (ref 0.44–1.00)
GFR calc non Af Amer: >60 mL/min (ref >60)
Glucose, Bld: 113 mg/dL — ABNORMAL HIGH (ref 65–99)
Potassium: 3.8 mmol/L (ref 3.5–5.1)
SODIUM: 136 mmol/L (ref 135–145)
Total Protein: 7.5 g/dL (ref 6.5–8.1)

## 2016-05-30 LAB — URINALYSIS, ROUTINE W REFLEX MICROSCOPIC
BILIRUBIN URINE: NEGATIVE
Bacteria, UA: NONE SEEN
GLUCOSE, UA: NEGATIVE mg/dL
KETONES UR: NEGATIVE mg/dL
Leukocytes, UA: NEGATIVE
Nitrite: NEGATIVE
Protein, ur: NEGATIVE mg/dL
Specific Gravity, Urine: 1.024 (ref 1.005–1.030)
pH: 8 (ref 5.0–8.0)

## 2016-05-30 LAB — CBC
HCT: 38.8 % (ref 36.0–46.0)
HEMOGLOBIN: 12.7 g/dL (ref 12.0–15.0)
MCH: 28 pg (ref 26.0–34.0)
MCHC: 32.7 g/dL (ref 30.0–36.0)
MCV: 85.5 fL (ref 78.0–100.0)
PLATELETS: 249 10*3/uL (ref 150–400)
RBC: 4.54 MIL/uL (ref 3.87–5.11)
RDW: 13.7 % (ref 11.5–15.5)
WBC: 6.8 10*3/uL (ref 4.0–10.5)

## 2016-05-30 LAB — LIPASE, BLOOD: Lipase: 21 U/L (ref 11–51)

## 2016-05-30 LAB — I-STAT BETA HCG BLOOD, ED (MC, WL, AP ONLY): I-stat hCG, quantitative: <5 m[IU]/mL (ref <5)

## 2016-05-30 MED ORDER — IOPAMIDOL (ISOVUE-300) INJECTION 61%
INTRAVENOUS | Status: AC
  Administered 2016-05-30: 100 mL
  Filled 2016-05-30: qty 100

## 2016-05-30 MED ORDER — OXYCODONE-ACETAMINOPHEN 5-325 MG PO TABS: 1.0000 | ORAL_TABLET | Freq: Four times a day (QID) | ORAL | 0 refills | Status: AC | PRN

## 2016-05-30 MED ORDER — HYDROCODONE-ACETAMINOPHEN 5-325 MG PO TABS
1.0000 | ORAL_TABLET | Freq: Once | ORAL | Status: AC
  Administered 2016-05-30: 1 via ORAL
  Filled 2016-05-30: qty 1

## 2016-05-30 NOTE — ED Triage Notes (Signed)
Pt had a sudden onset of lower abd pain. Pt states is sharp is nature. Pt deneis any n/v/d.

## 2016-05-30 NOTE — ED Notes (Signed)
Pt to CT at this time.

## 2016-05-30 NOTE — ED Provider Notes (Signed)
[facility] DEPT Provider Note   CSN: [id number] Arrival date & time: 05/30/16  1300     History   Chief Complaint Chief Complaint  Patient presents with  . Abdominal Pain    HPI [name] is a 26 y.o. female.  The history is provided by the patient and medical records.  Abdominal Pain   This is a new problem. Episode onset: Noon today. The problem occurs constantly. The problem has not changed since onset.The pain is associated with an unknown factor. The pain is located in the periumbilical region. The quality of the pain is sharp. The pain is at a severity of 6/10. The pain is moderate. Pertinent negatives include fever, diarrhea, nausea, vomiting, constipation, dysuria, hematuria and arthralgias. The symptoms are aggravated by palpation. Nothing relieves the symptoms.    Past Medical History:  Diagnosis Date  . Cardiomyopathy   . Facial angiofibroma   . Heart palpitations   . Irregular heartbeat   . Nosebleed     Patient Active Problem List   Diagnosis Date Noted  . Acne rosacea 04/07/2013  . Insomnia 04/07/2013  . Epistaxis 08/28/2012  . Palpitations 06/23/2011  . Cardiomyopathy ([place]) 06/09/2010  . Preop cardiovascular exam 06/07/2010  . Nonspecific abnormal electrocardiogram (ECG) (EKG) 06/07/2010  . Chest pain 06/07/2010    Past Surgical History:  Procedure Laterality Date  . NASAL HEMORRHAGE CONTROL Bilateral 08/28/2012   Procedure: EPISTAXIS CONTROL;  Surgeon: Jerrell Belfast, MD;  Location: [place];  Service: ENT;  Laterality: Bilateral;    OB History    No data available       Home Medications    Prior to Admission medications   Medication Sig Start Date End Date Taking? Authorizing Provider  Melatonin 3 MG CAPS Take 1 capsule (3 mg total) by mouth at bedtime as needed. Patient not taking: Reported on 05/30/2016 12/03/15   Evelene Croon Barrett, PA-C  metoprolol succinate (TOPROL-XL) 25 MG 24 hr tablet Take 1 tablet (25 mg total) by mouth  daily. Patient not taking: Reported on 05/30/2016 12/03/15   Evelene Croon Barrett, PA-C  oxyCODONE-acetaminophen (PERCOCET/ROXICET) 5-325 MG tablet Take 1 tablet by mouth every 6 (six) hours as needed for severe pain. 05/30/16 06/03/16  Jenny Reichmann, MD    Family History Family History  Problem Relation Age of Onset  . Heart disease Father     Unknown type    Social History Social History  Substance Use Topics  . Smoking status: Never Smoker  . Smokeless tobacco: Never Used  . Alcohol use No     Allergies   Patient has no known allergies.   Review of Systems Review of Systems  Constitutional: Negative for chills and fever.  HENT: Negative for ear pain and sore throat.   Eyes: Negative for pain and visual disturbance.  Respiratory: Negative for cough and shortness of breath.   Cardiovascular: Negative for chest pain and palpitations.  Gastrointestinal: Positive for abdominal pain. Negative for constipation, diarrhea, nausea and vomiting.  Genitourinary: Negative for dysuria and hematuria.  Musculoskeletal: Negative for arthralgias and back pain.  Skin: Negative for color change and rash.  Neurological: Negative for seizures and syncope.  All other systems reviewed and are negative.    Physical Exam Updated Vital Signs BP 124/70 (BP Location: Left Arm)   Pulse 92   Temp 97.9 F (36.6 C) (Oral)   Resp 19   LMP 05/28/2016   SpO2 100%   Physical Exam  Constitutional: She appears well-developed and well-nourished. No  distress.  HENT:  Head: Normocephalic and atraumatic.  Angiofibromas on the nose/face  Eyes: Conjunctivae are normal.  Neck: Neck supple.  Cardiovascular: Normal rate and regular rhythm.   No murmur heard. Pulmonary/Chest: Effort normal and breath sounds normal. No respiratory distress.  Abdominal: Soft. There is tenderness (TTP in periumbilical region).  Musculoskeletal: She exhibits no edema.  Neurological: She is alert.  Skin: Skin is warm and dry.   Psychiatric: She has a normal mood and affect.  Nursing note and vitals reviewed.  ED Treatments / Results  Labs (all labs ordered are listed, but only abnormal results are displayed) Labs Reviewed  WET PREP, GENITAL - Abnormal; Notable for the following:       Result Value   WBC, Wet Prep HPF POC MANY (*)    All other components within normal limits  COMPREHENSIVE METABOLIC PANEL - Abnormal; Notable for the following:    Glucose, Bld 113 (*)    All other components within normal limits  URINALYSIS, ROUTINE W REFLEX MICROSCOPIC - Abnormal; Notable for the following:    Color, Urine STRAW (*)    Hgb urine dipstick SMALL (*)    Squamous Epithelial / LPF 0-5 (*)    All other components within normal limits  LIPASE, BLOOD  CBC  I-STAT BETA HCG BLOOD, ED ([facility], [facility], [facility] ONLY)  GC/CHLAMYDIA PROBE AMP ([place]) NOT AT [facility]    EKG  EKG Interpretation None       Radiology Ct Abdomen Pelvis W Contrast  Result Date: 05/30/2016 CLINICAL DATA:  Lower abdominal pain EXAM: CT ABDOMEN AND PELVIS WITH CONTRAST TECHNIQUE: Multidetector CT imaging of the abdomen and pelvis was performed using the standard protocol following bolus administration of intravenous contrast. CONTRAST:  17mL ISOVUE-300 IOPAMIDOL (ISOVUE-300) INJECTION 61% COMPARISON:  None. FINDINGS: Lower chest: Lung bases are clear. Hepatobiliary: Liver is within normal limits. Gallbladder is unremarkable. No intrahepatic or extrahepatic ductal dilatation. Pancreas: Within normal limits. Spleen: Within normal limits. Adrenals/Urinary Tract: Adrenal glands are within normal limits. Numerous bilateral renal lesions/ masses, including a dominant 3.6 x 5.9 x 5.4 cm enhancing mass with macroscopic fat along the lateral left upper kidney (series 3/ image 20), compatible with a benign renal angiomyolipoma. Additional dominant renal angiolipomas include a 3.6 x 2.7 x 3.3 cm lesion along the medial interpolar right kidney (series 3/ image 32)  and a 2.1 x 2.4 x 2.0 cm lesion along the anterior right lower kidney (series 3/ image 37). Numerous subcentimeter fat containing lesions bilaterally. No hydronephrosis. Bladder is within normal limits. Stomach/Bowel: Stomach is within normal limits. No evidence of bowel obstruction. Normal appendix (series 3/image 54). Vascular/Lymphatic: No evidence of abdominal aortic aneurysm. No suspicious abdominopelvic lymphadenopathy. Reproductive: Uterus is unremarkable. Right ovary is notable for a 2.9 cm dominant simple cyst/follicle. Left ovary is grossly unremarkable although poorly visualized on CT. Other: Moderate pelvic ascites/ hemorrhage. Mild perisplenic hemorrhage. Mild perihepatic hemorrhage. Notably, there is no perinephric/extraperitoneal hemorrhage. Musculoskeletal: No suspicious osseous lesions. IMPRESSION: Moderate abdominopelvic hemorrhage. In this patient, given the appearance/ distribution and patient demographics, this is favored to be related to a ruptured hemorrhagic ovarian cyst. Numerous bilateral renal masses, almost all of which contain macroscopic fat, compatible with benign renal angiomyolipomas. Dominant mass measures 5.9 cm in the lateral left upper kidney. 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 No perinephric or extraperitoneal hemorrhage. Electronically Signed   By: Julian Hy M.D.   On: 05/30/2016 16:46    Procedures Procedures (including critical care time)  Medications Ordered in ED Medications  HYDROcodone-acetaminophen (NORCO/VICODIN) 5-325 MG per tablet 1 tablet (1 tablet Oral Given 05/30/16 1536)  iopamidol (ISOVUE-300) 61 % injection (100 mLs  Contrast Given 05/30/16 1600)     Initial Impression / Assessment and Plan / ED Course  I have reviewed the triage vital signs and the nursing notes.  Pertinent labs & imaging results that were available during my care of the patient were reviewed by me and considered in my medical decision making (see  chart for details).    Pt presents with abdominal pain. Pain started acutely at 1200, has been constant, localized to the periumbilical region, described as sharp, exacerbated by palpation & partially relieved by IM Toradol. She denies F/C, HA, lightheadedness, cough, CP, SOB, N/V/D/C, or GU symptoms. LMP finished Sunday.  VS & exam as above. PO analgesia provided. Hcg negative. UA w/o signs of infection. Labs unremarkable. Pt requested a female for pelvic examination; see that note for specifics.  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  Explained all results to the Pt. Will discharge the Pt home with prescription for Percocet. Recommending follow-up with PCP & OB/GYN. ED return precautions provided. Pt acknowledged understanding of, and concurrence with the plan. All questions answered to her satisfaction. In stable condition at the time of discharge.  Final Clinical Impressions(s) / ED Diagnoses   Final diagnoses:  Ruptured cyst of ovary    New Prescriptions New Prescriptions   OXYCODONE-ACETAMINOPHEN (PERCOCET/ROXICET) 5-325 MG TABLET    Take 1 tablet by mouth every 6 (six) hours as needed for severe pain.     Jenny Reichmann, MD 05/30/16 1739    Duffy Bruce, MD 06/01/16 1128

## 2016-05-30 NOTE — ED Notes (Signed)
Pt requesting pain medication.  

## 2016-05-30 NOTE — ED Notes (Signed)
Pt requesting more pain med. At this time

## 2016-05-30 NOTE — ED Notes (Signed)
Patient transported to CT 

## 2016-05-30 NOTE — ED Provider Notes (Signed)
THIS IS A SHARED VISIT WITH DR. ISAACS. PATIENT REQUESTED THAT A FEMALE DO HER PELVIC EXAM.    Pelvic Exam: External genitalia without lesions. White d/c vaginal vault, no blood noted, positive CMT, no adnexal tenderness or mass palpated, uterus without palpable enlargement.   Collected cultures for GC and Chlamydia and collected wet prep.  Patient tolerated the procedure without any immediate complications. Patient did complain of pain during the exam.     Ashley Murrain, NP 05/30/16 [place], MD 06/01/16 [phone]

## 2016-05-31 LAB — GC/CHLAMYDIA PROBE AMP (~~LOC~~) NOT AT ARMC
Chlamydia: NEGATIVE
Neisseria Gonorrhea: NEGATIVE

## 2016-06-01 ENCOUNTER — Emergency Department (HOSPITAL_COMMUNITY): Payer: PRIVATE HEALTH INSURANCE

## 2016-06-01 ENCOUNTER — Emergency Department (HOSPITAL_COMMUNITY)
Admission: EM | Admit: 2016-06-01 | Discharge: 2016-06-01 | Disposition: A | Discharge status: Home / Self Care | Payer: PRIVATE HEALTH INSURANCE | Attending: Emergency Medicine | Admitting: Emergency Medicine

## 2016-06-01 ENCOUNTER — Encounter (HOSPITAL_COMMUNITY): Payer: Self-pay

## 2016-06-01 DIAGNOSIS — R109 Unspecified abdominal pain: Secondary | ICD-10-CM

## 2016-06-01 DIAGNOSIS — R1084 Generalized abdominal pain: Secondary | ICD-10-CM | POA: Insufficient documentation

## 2016-06-01 LAB — BASIC METABOLIC PANEL
ANION GAP: 6 (ref 5–15)
BUN: 13 mg/dL (ref 6–20)
CALCIUM: 9.1 mg/dL (ref 8.9–10.3)
CO2: 27 mmol/L (ref 22–32)
Chloride: 107 mmol/L (ref 101–111)
Creatinine, Ser: 0.56 mg/dL (ref 0.44–1.00)
GLUCOSE: 95 mg/dL (ref 65–99)
Potassium: 3.8 mmol/L (ref 3.5–5.1)
SODIUM: 140 mmol/L (ref 135–145)

## 2016-06-01 LAB — CBC WITH DIFFERENTIAL/PLATELET
BASOS ABS: 0 10*3/uL (ref 0.0–0.1)
BASOS PCT: 0 %
EOS ABS: 0.1 10*3/uL (ref 0.0–0.7)
EOS PCT: 1 %
HCT: 34.7 % — ABNORMAL LOW (ref 36.0–46.0)
Hemoglobin: 10.9 g/dL — ABNORMAL LOW (ref 12.0–15.0)
Lymphocytes Relative: 22 %
Lymphs Abs: 1.7 10*3/uL (ref 0.7–4.0)
MCH: 27.5 pg (ref 26.0–34.0)
MCHC: 31.4 g/dL (ref 30.0–36.0)
MCV: 87.4 fL (ref 78.0–100.0)
MONO ABS: 0.4 10*3/uL (ref 0.1–1.0)
Monocytes Relative: 6 %
Neutro Abs: 5.2 10*3/uL (ref 1.7–7.7)
Neutrophils Relative %: 71 %
PLATELETS: 211 10*3/uL (ref 150–400)
RBC: 3.97 MIL/uL (ref 3.87–5.11)
RDW: 14.1 % (ref 11.5–15.5)
WBC: 7.4 10*3/uL (ref 4.0–10.5)

## 2016-06-01 MED ORDER — HYDROMORPHONE HCL 1 MG/ML IJ SOLN
1.0000 mg | Freq: Once | INTRAMUSCULAR | Status: AC
  Administered 2016-06-01: 1 mg via INTRAVENOUS
  Filled 2016-06-01: qty 1

## 2016-06-01 MED ORDER — SODIUM CHLORIDE 0.9 % IV SOLN
INTRAVENOUS | Status: DC
  Administered 2016-06-01: 15:00:00 via INTRAVENOUS

## 2016-06-01 MED ORDER — SODIUM CHLORIDE 0.9 % IV BOLUS (SEPSIS)
500.0000 mL | Freq: Once | INTRAVENOUS | Status: AC
  Administered 2016-06-01: 500 mL via INTRAVENOUS

## 2016-06-01 MED ORDER — HYDROMORPHONE HCL 4 MG PO TABS: 4.0000 mg | ORAL_TABLET | Freq: Four times a day (QID) | ORAL | 0 refills | Status: DC | PRN

## 2016-06-01 MED ORDER — NAPROXEN 500 MG PO TABS: 500.0000 mg | ORAL_TABLET | Freq: Two times a day (BID) | ORAL | 0 refills | Status: DC

## 2016-06-01 MED ORDER — KETOROLAC TROMETHAMINE 15 MG/ML IJ SOLN
15.0000 mg | Freq: Once | INTRAMUSCULAR | Status: AC
  Administered 2016-06-01: 15 mg via INTRAVENOUS
  Filled 2016-06-01: qty 1

## 2016-06-01 MED ORDER — ONDANSETRON HCL 4 MG/2ML IJ SOLN
4.0000 mg | Freq: Once | INTRAMUSCULAR | Status: AC
  Administered 2016-06-01: 4 mg via INTRAVENOUS
  Filled 2016-06-01: qty 2

## 2016-06-01 NOTE — ED Notes (Signed)
Patient transported to Ultrasound 

## 2016-06-01 NOTE — Discharge Instructions (Signed)
Take the Naprosyn on a regular basis. Supplement with the Percocet or the hydromorphone. Make an appointment to follow-up with your doctor or follow-up at a women's acute care clinic. Return for any new or worse symptoms. Work note provided.

## 2016-06-01 NOTE — ED Provider Notes (Signed)
[facility] DEPT Provider Note   CSN: [id number] Arrival date & time: 06/01/16  1330  By signing my name below, I, Ethelle Lyon Long, attest that this documentation has been prepared under the direction and in the presence of Fredia Sorrow, MD. Electronically Signed: Ethelle Lyon Long, Scribe. 06/01/2016. 2:17 PM.  History   Chief Complaint Chief Complaint  Patient presents with  . Abdominal Pain   The history is provided by the patient and medical records. No language interpreter was used.  Abdominal Pain   This is a recurrent problem. The current episode started 2 days ago. The problem occurs constantly. The problem has not changed since onset.The pain is associated with an unknown factor. The pain is located in the generalized abdominal region. The pain is at a severity of 8/10. Associated symptoms include dysuria. Pertinent negatives include fever, diarrhea, nausea, vomiting, hematuria and headaches. The symptoms are aggravated by activity. Nothing relieves the symptoms. Past workup includes CT scan.    HPI Comments:  [name] is a 26 y.o. female with a PMHx of Facial Angiofibroma and Cardiomyopathy, who presents to the Emergency Department complaining of recurrent, constant, 8/10 abdominal pain onset two days ago. Per chart review, pt was seen here two days ago for similar pain and Dx'd with a ruptured ovarian cyst. She was Rx'd Percocet but states this has not helped with her pain leading her to be seen in the ED this afternoon. She has not yet made an appointment with an OB/GYN following her last visit. Pt has associated symptoms of intermittent CP and dysuria. She also notes feeling some dizziness s/p taking her percocet. Ambulation exacerbates her abdominal pain. Pt denies fever, congestion, rhinorrhea, sore throat, visual disturbance, cough, SOB, leg swelling, diarrhea, nausea, vomiting, hematuria, vaginal pain, vaginal discharge, vaginal bleeding, back pain, neck pain, rash,  HA, syncope, bruising/bleeding easily, confusion. Pt is not currently on anticoagulants. Pt currently has a PCP but no OB/GYN.    Past Medical History:  Diagnosis Date  . Cardiomyopathy   . Facial angiofibroma   . Heart palpitations   . Irregular heartbeat   . Nosebleed    Patient Active Problem List   Diagnosis Date Noted  . Acne rosacea 04/07/2013  . Insomnia 04/07/2013  . Epistaxis 08/28/2012  . Palpitations 06/23/2011  . Cardiomyopathy ([place]) 06/09/2010  . Preop cardiovascular exam 06/07/2010  . Nonspecific abnormal electrocardiogram (ECG) (EKG) 06/07/2010  . Chest pain 06/07/2010   Past Surgical History:  Procedure Laterality Date  . NASAL HEMORRHAGE CONTROL Bilateral 08/28/2012   Procedure: EPISTAXIS CONTROL;  Surgeon: Jerrell Belfast, MD;  Location: [place];  Service: ENT;  Laterality: Bilateral;   OB History    No data available     Home Medications    Prior to Admission medications   Medication Sig Start Date End Date Taking? Authorizing Provider  Melatonin 3 MG CAPS Take 1 capsule (3 mg total) by mouth at bedtime as needed. Patient not taking: Reported on 05/30/2016 12/03/15   Evelene Croon Barrett, PA-C  metoprolol succinate (TOPROL-XL) 25 MG 24 hr tablet Take 1 tablet (25 mg total) by mouth daily. Patient not taking: Reported on 05/30/2016 12/03/15   Evelene Croon Barrett, PA-C  oxyCODONE-acetaminophen (PERCOCET/ROXICET) 5-325 MG tablet Take 1 tablet by mouth every 6 (six) hours as needed for severe pain. 05/30/16 06/03/16  Jenny Reichmann, MD    Family History Family History  Problem Relation Age of Onset  . Heart disease Father     Unknown type  Social History Social History  Substance Use Topics  . Smoking status: Never Smoker  . Smokeless tobacco: Never Used  . Alcohol use No   Allergies   Patient has no known allergies.  Review of Systems Review of Systems  Constitutional: Negative for fever.  HENT: Negative for congestion, rhinorrhea and sore throat.   Eyes:  Negative for visual disturbance.  Respiratory: Negative for cough and shortness of breath.   Cardiovascular: Positive for chest pain. Negative for leg swelling.  Gastrointestinal: Positive for abdominal pain. Negative for diarrhea, nausea and vomiting.  Genitourinary: Positive for dysuria. Negative for hematuria, vaginal bleeding, vaginal discharge and vaginal pain.  Musculoskeletal: Negative for back pain and neck pain.  Skin: Negative for rash.  Neurological: Negative for syncope and headaches.  Hematological: Does not bruise/bleed easily.  Psychiatric/Behavioral: Negative for confusion.   Physical Exam Updated Vital Signs BP 121/77 (BP Location: Right Arm)   Pulse 93   Temp 98.1 F (36.7 C) (Oral)   Resp 16   LMP 05/28/2016   SpO2 99%   Physical Exam  Constitutional: She is oriented to person, place, and time. She appears well-developed and well-nourished.  HENT:  Head: Normocephalic.  Mucus membranes moist.  Eyes: Conjunctivae and EOM are normal. Pupils are equal, round, and reactive to light.  Pupils normal, sclera clear, eyes tracking normal.  Neck: Normal range of motion.  Cardiovascular: Normal rate, regular rhythm and normal heart sounds.  Exam reveals no gallop and no friction rub.   No murmur heard. No bilateral ankle edema.  Pulmonary/Chest: Effort normal and breath sounds normal. No respiratory distress. She has no wheezes. She has no rales.  Lungs clear bilaterally.  Abdominal: Soft. She exhibits no distension. There is tenderness.  Diffuse abdominal tenderness.  Musculoskeletal: Normal range of motion.  Neurological: She is alert and oriented to person, place, and time.  Skin: Skin is warm and dry.  Psychiatric: She has a normal mood and affect.  Nursing note and vitals reviewed.  ED Treatments / Results  DIAGNOSTIC STUDIES:  Oxygen Saturation is 99% on RA, normal by my interpretation.    COORDINATION OF CARE:  2:09 PM Discussed treatment plan with pt  at bedside including blood work, anti-emetics, [place] A/P, IV Fluids and pain medication, and pt agreed to plan.  Labs (all labs ordered are listed, but only abnormal results are displayed) Labs Reviewed  CBC WITH DIFFERENTIAL/PLATELET - Abnormal; Notable for the following:       Result Value   Hemoglobin 10.9 (*)    HCT 34.7 (*)    All other components within normal limits  BASIC METABOLIC PANEL    EKG  EKG Interpretation None       Radiology US Pelvis Complete  Result Date: 06/01/2016 CLINICAL DATA:  Midline pelvic pain. EXAM: TRANSABDOMINAL ULTRASOUND OF PELVIS DOPPLER ULTRASOUND OF OVARIES TECHNIQUE: Transabdominal ultrasound examination of the pelvis was performed including evaluation of the uterus, ovaries, adnexal regions, and pelvic cul-de-sac. Color and duplex Doppler ultrasound was utilized to evaluate blood flow to the ovaries. COMPARISON:  CT 05/30/2016 FINDINGS: Uterus Measurements: 5.9 x 2.8 x 3.5 cm. No fibroids or other mass visualized. Endometrium Thickness: 7 mm in thickness.  No focal abnormality visualized. Right ovary Measurements: 4.7 x 3.9 x 3.8 cm. 2 cm dominant follicle. Normal appearance/no adnexal mass. Left ovary Measurements: 4.7 x 2.4 x 3.8 cm. Normal appearance/no adnexal mass. Other findings:  Small amount of free fluid in the pelvis. Pulsed Doppler evaluation demonstrates normal low-resistance arterial  and venous waveforms in both ovaries. IMPRESSION: Unremarkable pelvic ultrasound. Dominant follicle in the right ovary. Patient refused transvaginal imaging. Electronically Signed   By: Rolm Baptise M.D.   On: 06/01/2016 15:16   Ct Abdomen Pelvis W Contrast  Result Date: 05/30/2016 CLINICAL DATA:  Lower abdominal pain EXAM: CT ABDOMEN AND PELVIS WITH CONTRAST TECHNIQUE: Multidetector CT imaging of the abdomen and pelvis was performed using the standard protocol following bolus administration of intravenous contrast. CONTRAST:  144mL ISOVUE-300 IOPAMIDOL  (ISOVUE-300) INJECTION 61% COMPARISON:  None. FINDINGS: Lower chest: Lung bases are clear. Hepatobiliary: Liver is within normal limits. Gallbladder is unremarkable. No intrahepatic or extrahepatic ductal dilatation. Pancreas: Within normal limits. Spleen: Within normal limits. Adrenals/Urinary Tract: Adrenal glands are within normal limits. Numerous bilateral renal lesions/ masses, including a dominant 3.6 x 5.9 x 5.4 cm enhancing mass with macroscopic fat along the lateral left upper kidney (series 3/ image 20), compatible with a benign renal angiomyolipoma. Additional dominant renal angiolipomas include a 3.6 x 2.7 x 3.3 cm lesion along the medial interpolar right kidney (series 3/ image 32) and a 2.1 x 2.4 x 2.0 cm lesion along the anterior right lower kidney (series 3/ image 37). Numerous subcentimeter fat containing lesions bilaterally. No hydronephrosis. Bladder is within normal limits. Stomach/Bowel: Stomach is within normal limits. No evidence of bowel obstruction. Normal appendix (series 3/image 54). Vascular/Lymphatic: No evidence of abdominal aortic aneurysm. No suspicious abdominopelvic lymphadenopathy. Reproductive: Uterus is unremarkable. Right ovary is notable for a 2.9 cm dominant simple cyst/follicle. Left ovary is grossly unremarkable although poorly visualized on CT. Other: Moderate pelvic ascites/ hemorrhage. Mild perisplenic hemorrhage. Mild perihepatic hemorrhage. Notably, there is no perinephric/extraperitoneal hemorrhage. Musculoskeletal: No suspicious osseous lesions. IMPRESSION: Moderate abdominopelvic hemorrhage. In this patient, given the appearance/ distribution and patient demographics, this is favored to be related to a ruptured hemorrhagic ovarian cyst. Numerous bilateral renal masses, almost all of which contain macroscopic fat, compatible with benign renal angiomyolipomas. Dominant mass measures 5.9 cm in the lateral left upper kidney. This suggested an underlying diagnosis of  tuberous sclerosis. No perinephric or extraperitoneal hemorrhage. Electronically Signed   By: Julian Hy M.D.   On: 05/30/2016 16:46   [place] Art/ven Flow Abd Pelv Doppler  Result Date: 06/01/2016 CLINICAL DATA:  Midline pelvic pain. EXAM: TRANSABDOMINAL ULTRASOUND OF PELVIS DOPPLER ULTRASOUND OF OVARIES TECHNIQUE: Transabdominal ultrasound examination of the pelvis was performed including evaluation of the uterus, ovaries, adnexal regions, and pelvic cul-de-sac. Color and duplex Doppler ultrasound was utilized to evaluate blood flow to the ovaries. COMPARISON:  CT 05/30/2016 FINDINGS: Uterus Measurements: 5.9 x 2.8 x 3.5 cm. No fibroids or other mass visualized. Endometrium Thickness: 7 mm in thickness.  No focal abnormality visualized. Right ovary Measurements: 4.7 x 3.9 x 3.8 cm. 2 cm dominant follicle. Normal appearance/no adnexal mass. Left ovary Measurements: 4.7 x 2.4 x 3.8 cm. Normal appearance/no adnexal mass. Other findings:  Small amount of free fluid in the pelvis. Pulsed Doppler evaluation demonstrates normal low-resistance arterial and venous waveforms in both ovaries. IMPRESSION: Unremarkable pelvic ultrasound. Dominant follicle in the right ovary. Patient refused transvaginal imaging. Electronically Signed   By: Rolm Baptise M.D.   On: 06/01/2016 15:16    Procedures Procedures (including critical care time)  Medications Ordered in ED Medications  0.9 %  sodium chloride infusion ( Intravenous New Bag/Given 06/01/16 1529)  sodium chloride 0.9 % bolus 500 mL (500 mLs Intravenous New Bag/Given 06/01/16 1529)  ondansetron (ZOFRAN) injection 4 mg (4 mg Intravenous Given  06/01/16 1528)  HYDROmorphone (DILAUDID) injection 1 mg (1 mg Intravenous Given 06/01/16 1528)     Initial Impression / Assessment and Plan / ED Course  I have reviewed the triage vital signs and the nursing notes.  Pertinent labs & imaging results that were available during my care of the patient were reviewed by me and  considered in my medical decision making (see chart for details).    Patient seen May 1 with same complaint. CT scan of the abdomen at that time that raised concerns for hemorrhagic cyst. A moderate amount of fluid in the pelvis. Patient returns today with persistent and worsening pain despite Percocet. Patient denied feeling lightheaded or as if she was to pass out. Abdominal pain was generalized.  Repeat labs here show a slight drop in her hemoglobin from 12-10. Please see test was negative on May 1 as well as pelvic cultures and pelvic exam without significant findings.  Ultrasound today of the pelvis with Doppler studies only shows a small amount of pelvic fluid. Right ovary with the some enlargement but no concern for neoplasm. May very well represent cyst.  Will treat patient with anti-inflammatory Naprosyn and also provide hydromorphone that she can supplement orally with Percocet as needed. Work note provided. Referral information provided for women's clinic.  Patient nontoxic no acute distress. No leukocytosis.   Final Clinical Impressions(s) / ED Diagnoses   Final diagnoses:  Abdominal pain  Abdominal pain    New Prescriptions New Prescriptions   No medications on file    I personally performed the services described in this documentation, which was scribed in my presence. The recorded information has been reviewed and is accurate.       Fredia Sorrow, MD 06/01/16 [id number]

## 2016-06-01 NOTE — ED Triage Notes (Signed)
Pt states she was seen here on 5/1 and diagnosed with ruptured ovarian cyst. She was given oxycodone but reports pain has not improved.

## 2016-06-01 NOTE — ED Notes (Signed)
Pt states 'I might get a little sleepy, I took my Percocet".

## 2016-06-09 ENCOUNTER — Ambulatory Visit (INDEPENDENT_AMBULATORY_CARE_PROVIDER_SITE_OTHER): Payer: PRIVATE HEALTH INSURANCE | Admitting: Clinical

## 2016-06-09 ENCOUNTER — Ambulatory Visit (INDEPENDENT_AMBULATORY_CARE_PROVIDER_SITE_OTHER): Payer: PRIVATE HEALTH INSURANCE | Admitting: Obstetrics & Gynecology

## 2016-06-09 ENCOUNTER — Encounter: Payer: Self-pay | Admitting: Obstetrics & Gynecology

## 2016-06-09 VITALS — BP 138/83 | HR 74 | Wt 134.0 lb

## 2016-06-09 DIAGNOSIS — Z1389 Encounter for screening for other disorder: Secondary | ICD-10-CM | POA: Diagnosis not present

## 2016-06-09 DIAGNOSIS — R102 Pelvic and perineal pain: Secondary | ICD-10-CM

## 2016-06-09 DIAGNOSIS — F4323 Adjustment disorder with mixed anxiety and depressed mood: Secondary | ICD-10-CM

## 2016-06-09 DIAGNOSIS — Z1331 Encounter for screening for depression: Secondary | ICD-10-CM

## 2016-06-09 MED ORDER — NORGESTREL-ETHINYL ESTRADIOL 0.3-30 MG-MCG PO TABS: 1.0000 | ORAL_TABLET | Freq: Every day | ORAL | 11 refills | Status: DC

## 2016-06-09 NOTE — Patient Instructions (Addendum)
My Safety Plan:   Step 1: Warning signs (thoughts, images, mood, situation, behavior) that a crisis may be developing: Intrusive thoughts  Step 2: Internal coping strategies: Things I can do to take my mind off my problems without contacting another person (music, relaxation technique, physical activity):  Listen to piano music, the Theorist, take a drive or walk  Step 3: People and social settings that provide distraction/ and I can ask for help: Name and contact: Thurman Coyer [phone] Name and contact: Albertine Grates [phone] Name and contact: Janeal Holmes   Step 4: Professionals or agencies I can contact during a crisis:  Local urgent care services:      1. 9-1-1     2. [facility] (24/7 walk-in) [address] [place], [place]     3. [place]: Intake- [phone]/ [phone]  Closest Urgent Care/Emergency Room Address: [facility] Suicide Prevention Lifeline Phone: [phone]  Step 6: Making the environment safe- Have friend/family member remove from home: Thurman Coyer will do this: * Weapons in the home- remove razor blades, etc. * Medication in the home (including Tylenol)  Step 7: The one thing that is most important to me and worth living for is: My friends  Signature of Patient: _________________________________________________  Signature of Provider: ________________________________________________

## 2016-06-09 NOTE — Progress Notes (Signed)
   Subjective:    Patient ID: [name], female    DOB: 04-19-1990, 26 y.o.   MRN: [id number]  HPI 26 yo Muslim G0 here today for follow up from ER visit at [facility] earlier this week. She had been there prior to most recent visit, also for pelvic pain. Had 2 u/s, the most recent one was normal, the first also normal with a ovulation cyst. She reports that her pain is "better".    Review of Systems CLINICAL DATA:  Midline pelvic pain.  EXAM: TRANSABDOMINAL ULTRASOUND OF PELVIS  DOPPLER ULTRASOUND OF OVARIES  TECHNIQUE: Transabdominal ultrasound examination of the pelvis was performed including evaluation of the uterus, ovaries, adnexal regions, and pelvic cul-de-sac.  Color and duplex Doppler ultrasound was utilized to evaluate blood flow to the ovaries.  COMPARISON:  CT 05/30/2016  FINDINGS: Uterus  Measurements: 5.9 x 2.8 x 3.5 cm. No fibroids or other mass visualized.  Endometrium  Thickness: 7 mm in thickness.  No focal abnormality visualized.  Right ovary  Measurements: 4.7 x 3.9 x 3.8 cm. 2 cm dominant follicle. Normal appearance/no adnexal mass.  Left ovary  Measurements: 4.7 x 2.4 x 3.8 cm. Normal appearance/no adnexal mass.  Other findings:  Small amount of free fluid in the pelvis.  Pulsed Doppler evaluation demonstrates normal low-resistance arterial and venous waveforms in both ovaries.  IMPRESSION: Unremarkable pelvic ultrasound. Dominant follicle in the right ovary.  Patient refused transvaginal imaging.  She reports that she is virginal, refuses a pap smear. She reports that she has a period every month, sometimes heavy, sometimes light. She works as a Quarry manager at [employer].    Objective:   Physical Exam [id number] Breathing, conversing, and ambulating normally She declines a physical exam    Assessment & Plan:  High score of PHQ9- she will see [facility]- discussed recommendation for pap smear  to rule out cervical cancer. She declines. Recurrent ovarian cysts- start OCPs (lo ovral prescribed)

## 2016-06-09 NOTE — BH Specialist Note (Signed)
Integrated Behavioral Health Initial Visit  MRN: [id number] Name: Sunni Richardson   Session Start time: 11:45 Session End time: 12:45 Total time: 1 hour  Type of Service: [place] Interpretor:No. Interpretor Name and Language: n/a   Warm Hand Off Completed.       SUBJECTIVE: Keyarra Rendall is a 26 y.o. female accompanied by patient. Patient was referred by Dr Hulan Fray for depression. Patient reports the following symptoms/concerns: Pt states her primary concern is grief of her best friend; needs to talk aloud about her feelings to cope. Duration of problem: Over two months; Severity of problem: severe  OBJECTIVE: Mood: Anxious and Depressed and Affect: Depressed and Tearful Risk of harm to self or others: Self-harm thoughts No plan to harm self or others   LIFE CONTEXT: Family and Social: Lives with friend; several supportive friends. Estranged from family. School/Work: Working one job; considering taking a second Self-Care: no substances Life Changes: Suicide of best friend  GOALS ADDRESSED: Patient will reduce symptoms of: anxiety, depression and stress and increase knowledge and/or ability of: self-management skills and also: Increase healthy adjustment to current life circumstances, Increase adequate support systems for patient/family and Begin healthy grieving over loss   INTERVENTIONS: Solution-Focused Strategies, Psychoeducation and/or Health Education and Link to [employer]  Standardized Assessments completed: GAD-7 and PHQ 2&9 with C-SSRS  ASSESSMENT: Patient currently experiencing Grief. Patient may benefit from psychoeducation and brief therapeutic intervention regarding coping with symptoms of grief.  PLAN: 1. Follow up with behavioral health clinician on : One week phone call 2. Behavioral recommendations:  -Obtain beautiful journal today, and begin writing thoughts and feelings in it daily -Begin search  for second job today -Try out sleep app tonight; continue nightly if it helps distract mind -Read educational material regarding coping with symptoms of anxiety with panic attack, and depression related to grief -Consider hospice's suicide loss support group -Establish care with [facility] [place] for ongoing therapy 3. Referral(s): [facility] (In Clinic) and [place] (LME/Outside Clinic)  [place]an [place], [place]  Depression screen [facility] 2/9 06/09/2016  Decreased Interest 2  Down, Depressed, Hopeless 3  PHQ - 2 Score 5  Altered sleeping 3  Change in appetite 2  Feeling bad or failure about yourself  3  Trouble concentrating 3  Moving slowly or fidgety/restless 3  Suicidal thoughts 3  PHQ-9 Score 22   GAD 7 : Generalized Anxiety Score 06/09/2016  Nervous, Anxious, on Edge 2  Control/stop worrying 2  Worry too much - different things 3  Trouble relaxing 3  Restless 3  Easily annoyed or irritable 3  Afraid - awful might happen 3  Total GAD 7 Score 19

## 2016-06-16 ENCOUNTER — Telehealth: Payer: Self-pay | Admitting: Clinical

## 2016-06-16 NOTE — Telephone Encounter (Signed)
Left HIPPA-compliant message to return call to [facility] from [place] for [name] at [facility] at [phone].

## 2016-06-19 ENCOUNTER — Telehealth: Payer: Self-pay | Admitting: Clinical

## 2016-06-19 NOTE — Telephone Encounter (Signed)
Follow-up mood check: pt says she is feeling much better after office visit, is following treatment plan; does not feel that [facility] meds are necessary at this time, as she feels she is currently coping well implementing self-coping strategies. Pt will make an appointment for [facility] services again, if needed.

## 2017-11-14 ENCOUNTER — Other Ambulatory Visit: Payer: Self-pay

## 2017-11-14 ENCOUNTER — Emergency Department (HOSPITAL_COMMUNITY)
Admission: EM | Admit: 2017-11-14 | Discharge: 2017-11-14 | Disposition: A | Discharge status: Home / Self Care | Payer: PRIVATE HEALTH INSURANCE | Attending: Emergency Medicine | Admitting: Emergency Medicine

## 2017-11-14 ENCOUNTER — Encounter (HOSPITAL_COMMUNITY): Payer: Self-pay | Admitting: Emergency Medicine

## 2017-11-14 DIAGNOSIS — A5901 Trichomonal vulvovaginitis: Secondary | ICD-10-CM

## 2017-11-14 DIAGNOSIS — F419 Anxiety disorder, unspecified: Secondary | ICD-10-CM | POA: Insufficient documentation

## 2017-11-14 DIAGNOSIS — Z79899 Other long term (current) drug therapy: Secondary | ICD-10-CM | POA: Insufficient documentation

## 2017-11-14 LAB — WET PREP, GENITAL
CLUE CELLS WET PREP: NONE SEEN
Sperm: NONE SEEN
YEAST WET PREP: NONE SEEN

## 2017-11-14 LAB — POC URINE PREG, ED: PREG TEST UR: NEGATIVE

## 2017-11-14 MED ORDER — LIDOCAINE HCL (PF) 1 % IJ SOLN
INTRAMUSCULAR | Status: AC
  Administered 2017-11-14: 5 mL
  Filled 2017-11-14: qty 5

## 2017-11-14 MED ORDER — AZITHROMYCIN 250 MG PO TABS
1000.0000 mg | ORAL_TABLET | Freq: Once | ORAL | Status: AC
  Administered 2017-11-14: 1000 mg via ORAL
  Filled 2017-11-14: qty 4

## 2017-11-14 MED ORDER — METRONIDAZOLE 500 MG PO TABS: 500.0000 mg | ORAL_TABLET | Freq: Two times a day (BID) | ORAL | 0 refills | Status: DC

## 2017-11-14 MED ORDER — CEFTRIAXONE SODIUM 250 MG IJ SOLR
250.0000 mg | Freq: Once | INTRAMUSCULAR | Status: AC
  Administered 2017-11-14: 250 mg via INTRAMUSCULAR
  Filled 2017-11-14: qty 250

## 2017-11-14 MED ORDER — METRONIDAZOLE 500 MG PO TABS
500.0000 mg | ORAL_TABLET | Freq: Once | ORAL | Status: AC
  Administered 2017-11-14: 500 mg via ORAL
  Filled 2017-11-14: qty 1

## 2017-11-14 NOTE — Discharge Instructions (Signed)
Take the antibiotics until completion, even if you are feeling better.  Do not have intercourse until you have been symptom-free and finish the antibiotic for about 1 week.  Follow-up with OB/GYN.

## 2017-11-14 NOTE — ED Notes (Signed)
Patient verbalizes understanding of discharge instructions. Opportunity for questioning and answers were provided. Armband removed by staff, pt discharged from ED ambulatory.   

## 2017-11-14 NOTE — ED Triage Notes (Addendum)
C/o vaginal itching and vaginal discharge x 1 week.  Denies pain.  States LMP 2 months ago- irregular.

## 2017-11-14 NOTE — ED Notes (Signed)
Female provider bedside for pelvic exam with female chaperone.

## 2017-11-14 NOTE — ED Provider Notes (Addendum)
Pt c/o vaginal itching. Seen primarily by Dr. Regenia Skeeter, however is requesting a female provider for pelvic exam.  Physical Exam  Constitutional: She is well-developed, well-nourished, and in no distress.  Genitourinary:  Genitourinary Comments: Exam performed by Rodney Booze,  exam chaperoned Date: 11/14/2017 Pelvic exam: normal external genitalia without evidence of trauma. VULVA: normal appearing vulva with no masses, tenderness or lesion. VAGINA: normal appearing vagina with normal color and discharge, no lesions. CERVIX: cervical os closed with purulent yellow discharge. Cervix is erythematous, no CMT. Wet prep and DNA probe for chlamydia and GC obtained.  ADNEXA: normal adnexa in size, nontender and no masses UTERUS: uterus is normal size, shape, consistency and nontender.   Nursing note and vitals reviewed.      Bishop Dublin 11/14/17 2116    Sherwood Gambler, MD 11/14/17 2211

## 2017-11-14 NOTE — ED Provider Notes (Signed)
[facility] EMERGENCY DEPARTMENT Provider Note   CSN: [id number] Arrival date & time: 11/14/17  2020     History   Chief Complaint Chief Complaint  Patient presents with  . Vaginal Itching    HPI [name] is a 27 y.o. female.  HPI  27 year old female presents with vaginal discharge and itching.  This is been ongoing for about a week.  Yesterday she had some transient mild abdominal pain that has gone away.  No urinary symptoms such as dysuria.  No vaginal bleeding.  Her menstrual cycles are very irregular and she has not had one in a couple months.  She states that she recently got out of a long-term relationship in about 2 weeks ago had intercourse with a new partner.  Did not use protection.  He has not endorsed any STI symptoms.  She has been using a new soap recently and wonders if this could be causing her symptoms.  Past Medical History:  Diagnosis Date  . Cardiomyopathy   . Facial angiofibroma   . Heart palpitations   . Irregular heartbeat   . Nosebleed     Patient Active Problem List   Diagnosis Date Noted  . Acne rosacea 04/07/2013  . Insomnia 04/07/2013  . Epistaxis 08/28/2012  . Palpitations 06/23/2011  . Cardiomyopathy ([place]) 06/09/2010  . Preop cardiovascular exam 06/07/2010  . Nonspecific abnormal electrocardiogram (ECG) (EKG) 06/07/2010  . Chest pain 06/07/2010    Past Surgical History:  Procedure Laterality Date  . NASAL HEMORRHAGE CONTROL Bilateral 08/28/2012   Procedure: EPISTAXIS CONTROL;  Surgeon: Jerrell Belfast, MD;  Location: [facility];  Service: ENT;  Laterality: Bilateral;     OB History   None      Home Medications    Prior to Admission medications   Medication Sig Start Date End Date Taking? Authorizing Provider  HYDROmorphone (DILAUDID) 4 MG tablet Take 1 tablet (4 mg total) by mouth every 6 (six) hours as needed for severe pain. Patient not taking: Reported on 06/09/2016 06/01/16   Fredia Sorrow, MD    Melatonin 3 MG CAPS Take 1 capsule (3 mg total) by mouth at bedtime as needed. Patient not taking: Reported on 05/30/2016 12/03/15   Barrett, Evelene Croon, PA-C  metoprolol succinate (TOPROL-XL) 25 MG 24 hr tablet Take 1 tablet (25 mg total) by mouth daily. Patient not taking: Reported on 05/30/2016 12/03/15   Barrett, Evelene Croon, PA-C  naproxen (NAPROSYN) 500 MG tablet Take 1 tablet (500 mg total) by mouth 2 (two) times daily. Patient not taking: Reported on 06/09/2016 06/01/16   Fredia Sorrow, MD  norgestrel-ethinyl estradiol (LO/OVRAL,CRYSELLE) 0.3-30 MG-MCG tablet Take 1 tablet by mouth daily. Patient not taking: Reported on 11/14/2017 06/09/16   Emily Filbert, MD    Family History Family History  Problem Relation Age of Onset  . Heart disease Father        Unknown type    Social History Social History   Tobacco Use  . Smoking status: Never Smoker  . Smokeless tobacco: Never Used  Substance Use Topics  . Alcohol use: No  . Drug use: No     Allergies   Patient has no known allergies.   Review of Systems Review of Systems  Constitutional: Negative for fever.  Gastrointestinal: Positive for abdominal pain (yesterday, mild, none now). Negative for nausea and vomiting.  Genitourinary: Positive for vaginal discharge. Negative for dysuria, vaginal bleeding and vaginal pain.  All other systems reviewed and are negative.  Physical Exam Updated Vital Signs BP (!) 151/112 (BP Location: Right Arm)   Pulse (!) 115   Temp 98.4 F (36.9 C) (Oral)   Resp 16   LMP 09/14/2017   SpO2 100%   Physical Exam  Constitutional: She appears well-developed and well-nourished.  HENT:  Head: Normocephalic and atraumatic.  Right Ear: External ear normal.  Left Ear: External ear normal.  Nose: Nose normal.  Eyes: Right eye exhibits no discharge. Left eye exhibits no discharge.  Pulmonary/Chest: Effort normal.  Abdominal: Soft. She exhibits no distension. There is no tenderness.   Genitourinary:  Genitourinary Comments: Patient prefers female to do exam. See [facility]out[facility] PA note  Neurological: She is alert.  Skin: Skin is warm and dry.  Psychiatric: Her mood appears anxious.  Nursing note and vitals reviewed.    ED Treatments / Results  Labs (all labs ordered are listed, but only abnormal results are displayed) Labs Reviewed  WET PREP, GENITAL - Abnormal; Notable for the following components:      Result Value   Trich, Wet Prep PRESENT (*)    WBC, Wet Prep HPF POC MANY (*)    All other components within normal limits  POC URINE PREG, ED  GC/CHLAMYDIA PROBE AMP ([place]) NOT AT [facility]    EKG None  Radiology No results found.  Procedures Procedures (including critical care time)  Medications Ordered in ED Medications  azithromycin (ZITHROMAX) tablet 1,000 mg (has no administration in time range)  cefTRIAXone (ROCEPHIN) injection 250 mg (250 mg Intramuscular Given 11/14/17 2156)  metroNIDAZOLE (FLAGYL) tablet 500 mg (500 mg Oral Given 11/14/17 2156)  lidocaine (PF) (XYLOCAINE) 1 % injection (5 mLs  Given 11/14/17 2156)     Initial Impression / Assessment and Plan / ED Course  I have reviewed the triage vital signs and the nursing notes.  Pertinent labs & imaging results that were available during my care of the patient were reviewed by me and considered in my medical decision making (see chart for details).     Exam by PA concerning for STI.  Trichomonas seen on wet prep.  Discussed with patient, will also treat with Rocephin and azithromycin.  Counseled on abstaining from intercourse while symptomatic and then at least a week after.  Otherwise, highly doubt PID given no abdominal pain or tenderness now.  She is not pregnant.  Discharged home with return precautions.  Final Clinical Impressions(s) / ED Diagnoses   Final diagnoses:  Trichomonal vaginitis    ED Discharge Orders    None       Sherwood Gambler, MD 11/14/17 2158

## 2017-11-15 LAB — GC/CHLAMYDIA PROBE AMP (~~LOC~~) NOT AT ARMC
Chlamydia: NEGATIVE
NEISSERIA GONORRHEA: NEGATIVE

## 2017-11-16 IMAGING — CT CT ABD-PELV W/ CM
2 of 4 series · 15 of 46 positions shown, 17 images · IV contrast (iopamidol)
Comparison: None.

CLINICAL DATA: Lower abdominal pain

EXAM:
CT ABDOMEN AND PELVIS WITH CONTRAST
TECHNIQUE: Multidetector CT imaging of the abdomen and pelvis was performed
using the standard protocol following bolus administration of
intravenous contrast.
CONTRAST:  100mL SS19EE-1JJ IOPAMIDOL (SS19EE-1JJ) INJECTION 61%

[Series 3: abdroutine 5.0 i31s 1 · axial · 0.74mm/px · z∈[-423,-18]mm · 12 of 93 slices shown, 14 images]
[im 8/93  soft-tissue]
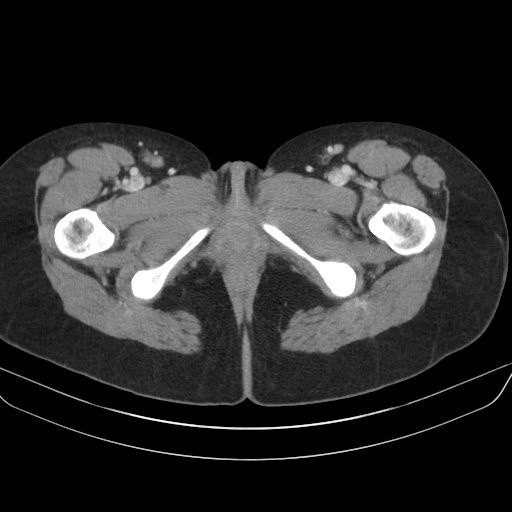
[im 8/93  bone]
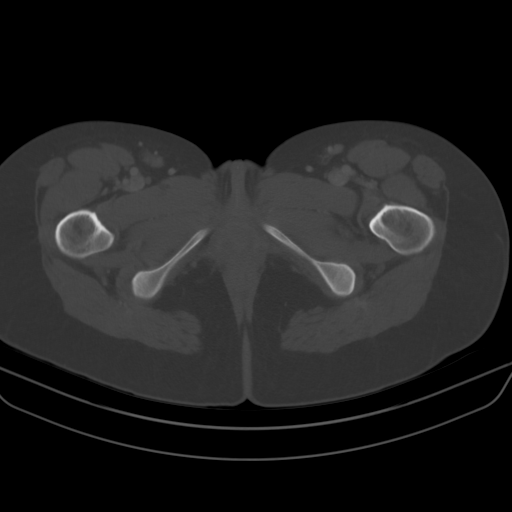
[im 15/93  soft-tissue]
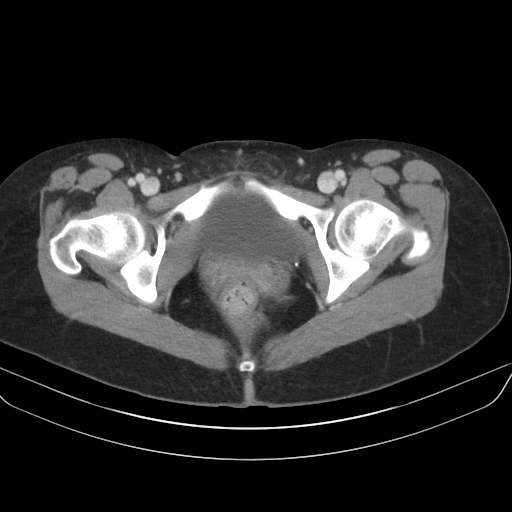
[im 23/93  soft-tissue]
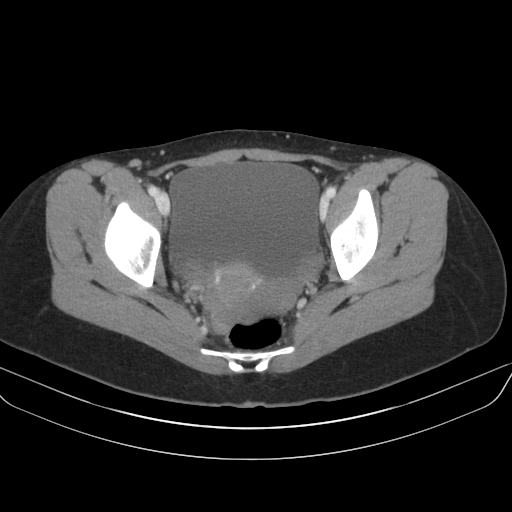
[im 30/93  soft-tissue]
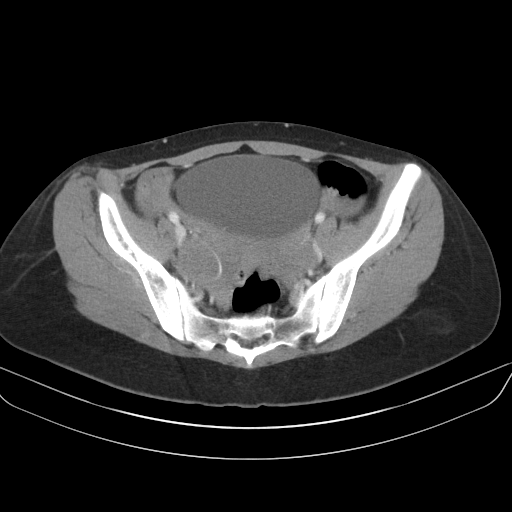
[im 37/93  soft-tissue]
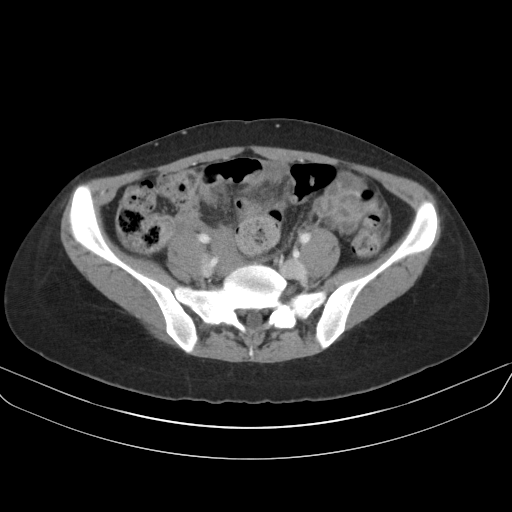
[im 45/93  soft-tissue]
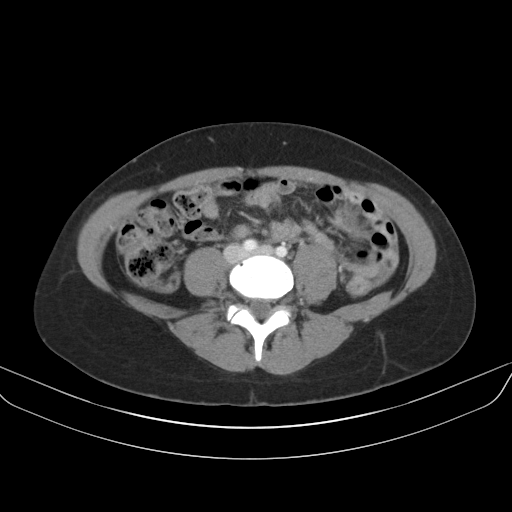
[im 52/93  soft-tissue]
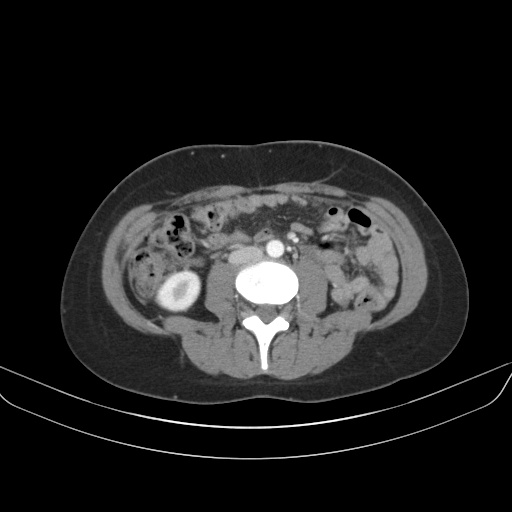
[im 59/93  soft-tissue]
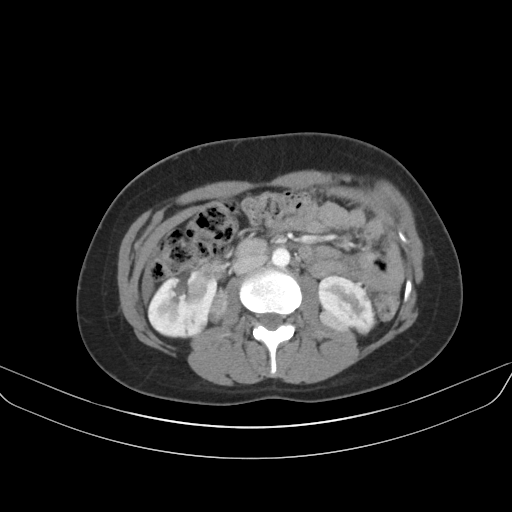
[im 67/93  soft-tissue]
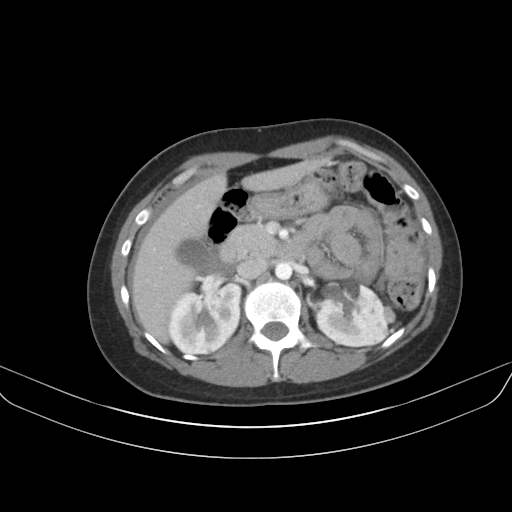
[im 67/93  bone]
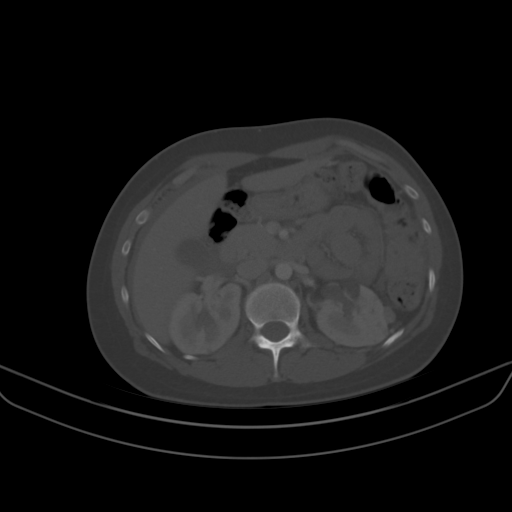
[im 74/93  soft-tissue]
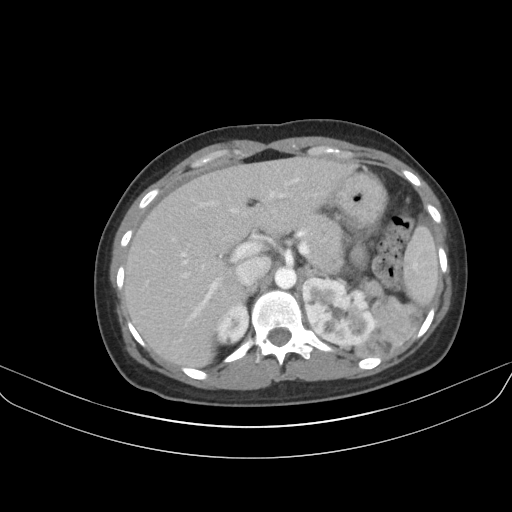
[im 81/93  soft-tissue]
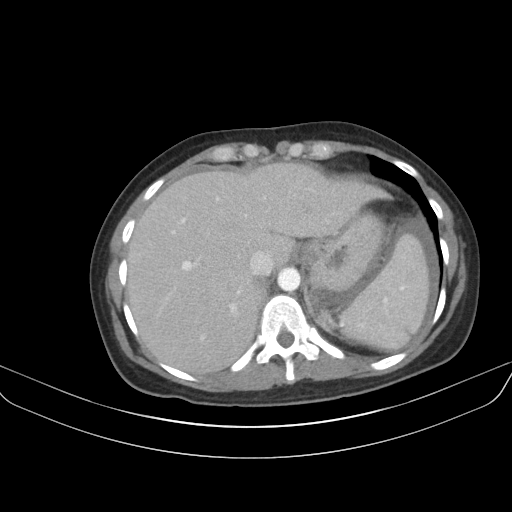
[im 89/93  soft-tissue]
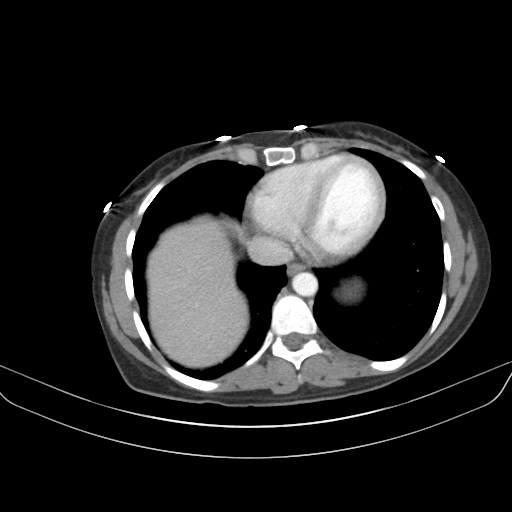

[Series 6: abdroutine 2.0 mpr cor · coronal · 0.72mm/px · 3 of 93 slices shown]
[im 31/93  soft-tissue]
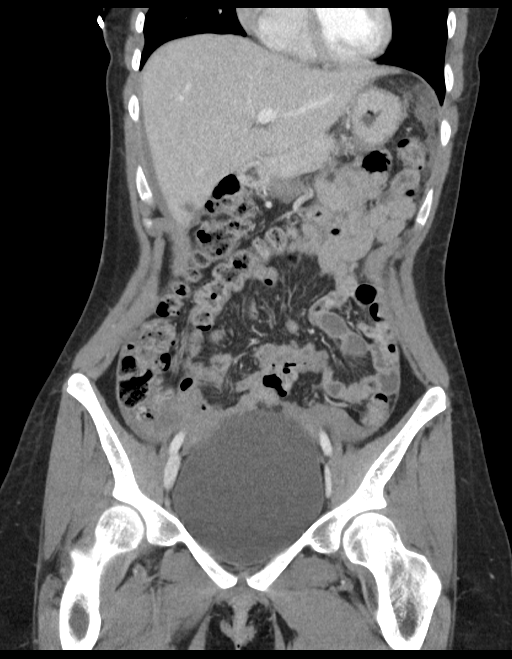
[im 41/93  soft-tissue]
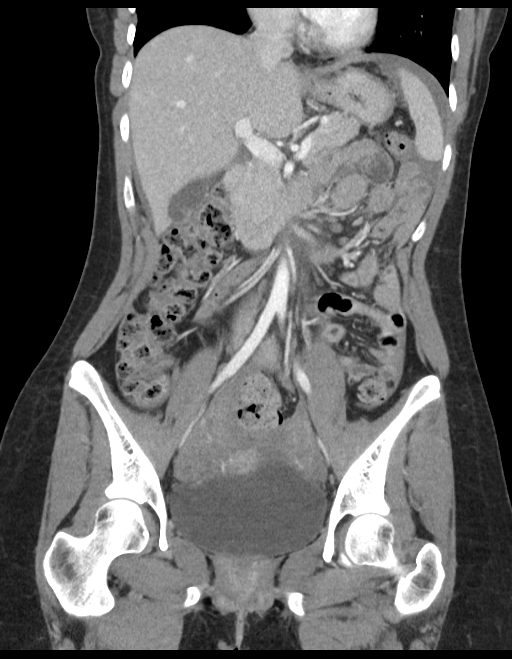
[im 52/93  soft-tissue]
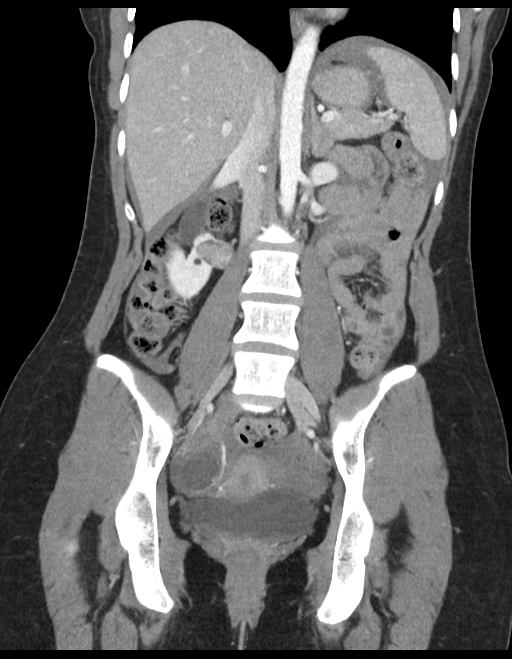

[15 of 46 positions shown; findings below may reference images not displayed]

FINDINGS: Lower chest: Lung bases are clear.

Hepatobiliary: Liver is within normal limits.

Gallbladder is unremarkable. No intrahepatic or extrahepatic ductal
dilatation.

Pancreas: Within normal limits.

Spleen: Within normal limits.

Adrenals/Urinary Tract: Adrenal glands are within normal limits.

Numerous bilateral renal lesions/ masses, including a dominant 3.6 x
5.9 x 5.4 cm enhancing mass with macroscopic fat along the lateral
left upper kidney (series 3/ image 20), compatible with a benign
renal angiomyolipoma.

Additional dominant renal angiolipomas include a 3.6 x 2.7 x 3.3 cm
lesion along the medial interpolar right kidney (series 3/ image 32)
and a 2.1 x 2.4 x 2.0 cm lesion along the anterior right lower
kidney (series 3/ image 37).

Numerous subcentimeter fat containing lesions bilaterally. No
hydronephrosis.

Bladder is within normal limits.

Stomach/Bowel: Stomach is within normal limits.

No evidence of bowel obstruction.

Normal appendix (series 3/image 54).

Vascular/Lymphatic: No evidence of abdominal aortic aneurysm.

No suspicious abdominopelvic lymphadenopathy.

Reproductive: Uterus is unremarkable.

Right ovary is notable for a 2.9 cm dominant simple cyst/follicle.
Left ovary is grossly unremarkable although poorly visualized on CT.

Other: Moderate pelvic ascites/ hemorrhage. Mild perisplenic
hemorrhage. Mild perihepatic hemorrhage.

Notably, there is no perinephric/extraperitoneal hemorrhage.

Musculoskeletal: No suspicious osseous lesions.
IMPRESSION: Moderate abdominopelvic hemorrhage. In this patient, given the
appearance/ distribution and patient demographics, this is favored
to be related to a ruptured hemorrhagic ovarian cyst.

Numerous bilateral renal masses, almost all of which contain
macroscopic fat, compatible with benign renal angiomyolipomas.
Dominant mass measures 5.9 cm in the lateral left upper kidney. This
suggested an underlying diagnosis of tuberous sclerosis.

No perinephric or extraperitoneal hemorrhage.

## 2019-10-29 ENCOUNTER — Ambulatory Visit (HOSPITAL_COMMUNITY)
Admission: EM | Admit: 2019-10-29 | Discharge: 2019-10-30 | Disposition: A | Discharge status: Home / Self Care | Payer: No Payment, Other | Attending: Nurse Practitioner | Admitting: Nurse Practitioner

## 2019-10-29 ENCOUNTER — Other Ambulatory Visit: Payer: Self-pay

## 2019-10-29 DIAGNOSIS — F321 Major depressive disorder, single episode, moderate: Secondary | ICD-10-CM | POA: Insufficient documentation

## 2019-10-29 DIAGNOSIS — F411 Generalized anxiety disorder: Secondary | ICD-10-CM | POA: Insufficient documentation

## 2019-10-29 DIAGNOSIS — F16983 Hallucinogen use, unspecified with hallucinogen persisting perception disorder (flashbacks): Secondary | ICD-10-CM | POA: Insufficient documentation

## 2019-10-29 DIAGNOSIS — G4700 Insomnia, unspecified: Secondary | ICD-10-CM | POA: Insufficient documentation

## 2019-10-29 DIAGNOSIS — R450 Nervousness: Secondary | ICD-10-CM | POA: Insufficient documentation

## 2019-10-29 DIAGNOSIS — R45851 Suicidal ideations: Secondary | ICD-10-CM | POA: Insufficient documentation

## 2019-10-29 DIAGNOSIS — F515 Nightmare disorder: Secondary | ICD-10-CM | POA: Insufficient documentation

## 2019-10-29 NOTE — ED Notes (Signed)
[place] [address] 

## 2019-10-30 DIAGNOSIS — F411 Generalized anxiety disorder: Secondary | ICD-10-CM | POA: Diagnosis not present

## 2019-10-30 DIAGNOSIS — F515 Nightmare disorder: Secondary | ICD-10-CM | POA: Diagnosis not present

## 2019-10-30 DIAGNOSIS — F16983 Hallucinogen use, unspecified with hallucinogen persisting perception disorder (flashbacks): Secondary | ICD-10-CM | POA: Diagnosis not present

## 2019-10-30 DIAGNOSIS — F321 Major depressive disorder, single episode, moderate: Secondary | ICD-10-CM | POA: Diagnosis not present

## 2019-10-30 MED ORDER — SERTRALINE HCL 25 MG PO TABS: 25.0000 mg | ORAL_TABLET | Freq: Every day | ORAL | 0 refills | Status: DC

## 2019-10-30 MED ORDER — HYDROXYZINE PAMOATE 25 MG PO CAPS: 25.0000 mg | ORAL_CAPSULE | Freq: Three times a day (TID) | ORAL | 0 refills | Status: DC | PRN
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
## 2019-10-30 NOTE — Discharge Instructions (Signed)
  Discharge recommendations:  Patient is to take medications as prescribed. Please see information for follow-up appointment with psychiatry and therapy. Please follow up with your primary care provider for all medical related needs.   Therapy: We recommend that patient participate in individual therapy to address mental health concerns.  Medications: Patient is to contact a medical professional and/or outpatient provider to address any new side effects that develop. Patient should update outpatient providers of any new medications and/or medication changes.   The patient should abstain from use of illicit substances/drugs and abuse of any medications. If symptoms worsen or do not continue to improve or if the patient becomes actively suicidal or homicidal then it is recommended that the patient return to the closet hospital emergency department, the [facility], or call 911 for further evaluation and treatment. National Suicide Prevention Lifeline 1-800-SUICIDE or (661)584-3766.

## 2019-10-30 NOTE — ED Notes (Signed)
Pt not seen by RN, AVS reviewed by APP & [employer] notified to provide ride home.
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
## 2019-12-22 ENCOUNTER — Ambulatory Visit (INDEPENDENT_AMBULATORY_CARE_PROVIDER_SITE_OTHER): Payer: Self-pay | Admitting: Family Medicine

## 2019-12-22 ENCOUNTER — Encounter: Payer: Self-pay | Admitting: Family Medicine

## 2019-12-22 ENCOUNTER — Other Ambulatory Visit: Payer: Self-pay

## 2019-12-22 VITALS — BP 134/89 | HR 94 | Temp 97.6°F | Ht 63.75 in | Wt 202.0 lb

## 2019-12-22 DIAGNOSIS — F32A Depression, unspecified: Secondary | ICD-10-CM

## 2019-12-22 MED ORDER — CITALOPRAM HYDROBROMIDE 20 MG PO TABS: 20.0000 mg | ORAL_TABLET | Freq: Every day | ORAL | 3 refills | Status: DC
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
## 2019-12-22 NOTE — Patient Instructions (Addendum)
Once you are able to get insurance schedule a follow up and we will get your labs and pap done.    National Suicide Prevention Lifeline at (640) 846-1011.  If you need any help in the meantime call the clinic or the ED.   Living With Depression Everyone experiences occasional disappointment, sadness, and loss in their lives. When you are feeling down, blue, or sad for at least 2 weeks in a row, it may mean that you have depression. Depression can affect your thoughts and feelings, relationships, daily activities, and physical health. It is caused by changes in the way your brain functions. If you receive a diagnosis of depression, your health care provider will tell you which type of depression you have and what treatment options are available to you. If you are living with depression, there are ways to help you recover from it and also ways to prevent it from coming back. How to cope with lifestyle changes Coping with stress     Stress is your bodys reaction to life changes and events, both good and bad. Stressful situations may include:  Getting married.  The death of a spouse.  Losing a job.  Retiring.  Having a baby. Stress can last just a few hours or it can be ongoing. Stress can play a major role in depression, so it is important to learn both how to cope with stress and how to think about it differently. Talk with your health care provider or a counselor if you would like to learn more about stress reduction. He or she may suggest some stress reduction techniques, such as:  Music therapy. This can include creating music or listening to music. Choose music that you enjoy and that inspires you.  Mindfulness-based meditation. This kind of meditation can be done while sitting or walking. It involves being aware of your normal breaths, rather than trying to control your breathing.  Centering prayer. This is a kind of meditation that involves focusing on a spiritual word or  phrase. Choose a word, phrase, or sacred image that is meaningful to you and that brings you peace.  Deep breathing. To do this, expand your stomach and inhale slowly through your nose. Hold your breath for 3-5 seconds, then exhale slowly, allowing your stomach muscles to relax.  Muscle relaxation. This involves intentionally tensing muscles then relaxing them. Choose a stress reduction technique that fits your lifestyle and personality. Stress reduction techniques take time and practice to develop. Set aside 5-15 minutes a day to do them. Therapists can offer training in these techniques. The training may be covered by some insurance plans. Other things you can do to manage stress include:  Keeping a stress diary. This can help you learn what triggers your stress and ways to control your response.  Understanding what your limits are and saying no to requests or events that lead to a schedule that is too full.  Thinking about how you respond to certain situations. You may not be able to control everything, but you can control how you react.  Adding humor to your life by watching funny films or TV shows.  Making time for activities that help you relax and not feeling guilty about spending your time this way.  Medicines Your health care provider may suggest certain medicines if he or she feels that they will help improve your condition. Avoid using alcohol and other substances that may prevent your medicines from working properly (may interact). It is also important to:  Talk with your pharmacist or health care provider about all the medicines that you take, their possible side effects, and what medicines are safe to take together.  Make it your goal to take part in all treatment decisions (shared decision-making). This includes giving input on the side effects of medicines. It is best if shared decision-making with your health care provider is part of your total treatment plan. If your health  care provider prescribes a medicine, you may not notice the full benefits of it for 4-8 weeks. Most people who are treated for depression need to be on medicine for at least 6-12 months after they feel better. If you are taking medicines as part of your treatment, do not stop taking medicines without first talking to your health care provider. You may need to have the medicine slowly decreased (tapered) over time to decrease the risk of harmful side effects. Relationships Your health care provider may suggest family therapy along with individual therapy and drug therapy. While there may not be family problems that are causing you to feel depressed, it is still important to make sure your family learns as much as they can about your mental health. Having your familys support can help make your treatment successful. How to recognize changes in your condition Everyone has a different response to treatment for depression. Recovery from major depression happens when you have not had signs of major depression for two months. This may mean that you will start to:  Have more interest in doing activities.  Feel less hopeless than you did 2 months ago.  Have more energy.  Overeat less often, or have better or improving appetite.  Have better concentration. Your health care provider will work with you to decide the next steps in your recovery. It is also important to recognize when your condition is getting worse. Watch for these signs:  Having fatigue or low energy.  Eating too much or too little.  Sleeping too much or too little.  Feeling restless, agitated, or hopeless.  Having trouble concentrating or making decisions.  Having unexplained physical complaints.  Feeling irritable, angry, or aggressive. Get help as soon as you or your family members notice these symptoms coming back. How to get support and help from others How to talk with friends and family members about your  condition  Talking to friends and family members about your condition can provide you with one way to get support and guidance. Reach out to trusted friends or family members, explain your symptoms to them, and let them know that you are working with a health care provider to treat your depression. Financial resources Not all insurance plans cover mental health care, so it is important to check with your insurance carrier. If paying for co-pays or counseling services is a problem, search for a local or county mental health care center. They may be able to offer public mental health care services at low or no cost when you are not able to see a private health care provider. If you are taking medicine for depression, you may be able to get the generic form, which may be less expensive. Some makers of prescription medicines also offer help to patients who cannot afford the medicines they need. Follow these instructions at home:   Get the right amount and quality of sleep.  Cut down on using caffeine, tobacco, alcohol, and other potentially harmful substances.  Try to exercise, such as walking or lifting small weights.  Take over-the-counter and  prescription medicines only as told by your health care provider.  Eat a healthy diet that includes plenty of vegetables, fruits, whole grains, low-fat dairy products, and lean protein. Do not eat a lot of foods that are high in solid fats, added sugars, or salt.  Keep all follow-up visits as told by your health care provider. This is important. Contact a health care provider if:  You stop taking your antidepressant medicines, and you have any of these symptoms: ? Nausea. ? Headache. ? Feeling lightheaded. ? Chills and body aches. ? Not being able to sleep (insomnia).  You or your friends and family think your depression is getting worse. Get help right away if:  You have thoughts of hurting yourself or others. If you ever feel like you may hurt  yourself or others, or have thoughts about taking your own life, get help right away. You can go to your nearest emergency department or call:  Your local emergency services (911 in the [place]  A suicide crisis helpline, such as the [place] at (442)216-5618. This is open 24-hours a day. Summary  If you are living with depression, there are ways to help you recover from it and also ways to prevent it from coming back.  Work with your health care team to create a management plan that includes counseling, stress management techniques, and healthy lifestyle habits. This information is not intended to replace advice given to you by your health care provider. Make sure you discuss any questions you have with your health care provider. Document Revised: 05/10/2018 Document Reviewed: 12/20/2015 Elsevier Patient Education  [employer].   If you have lab work done today you will be contacted with your lab results within the next 2 weeks.  If you have not heard from [place] then please contact us. The fastest way to get your results is to register for My Chart.   IF you received an x-ray today, you will receive an invoice from [facility]. Please contact [facility] at [phone] with questions or concerns regarding your invoice.   IF you received labwork today, you will receive an invoice from [facility]. Please contact [facility] at [phone] with questions or concerns regarding your invoice.   Our billing staff will not be able to assist you with questions regarding bills from these companies.  You will be contacted with the lab results as soon as they are available. The fastest way to get your results is to activate your My Chart account. Instructions are located on the last page of this paperwork. If you have not heard from [place] regarding the results in 2 weeks, please contact this office.

## 2020-01-26 ENCOUNTER — Ambulatory Visit: Payer: Self-pay | Admitting: Family Medicine

## 2020-01-27 ENCOUNTER — Encounter: Payer: Self-pay | Admitting: Family Medicine

## 2020-12-06 ENCOUNTER — Emergency Department (HOSPITAL_COMMUNITY)
Admission: EM | Admit: 2020-12-06 | Discharge: 2020-12-07 | Discharge status: Against Medical Advice | Payer: BC Managed Care – PPO | Attending: Student | Admitting: Student

## 2020-12-06 ENCOUNTER — Emergency Department (HOSPITAL_COMMUNITY): Payer: BC Managed Care – PPO

## 2020-12-06 ENCOUNTER — Encounter (HOSPITAL_COMMUNITY): Payer: Self-pay

## 2020-12-06 DIAGNOSIS — Z5321 Procedure and treatment not carried out due to patient leaving prior to being seen by health care provider: Secondary | ICD-10-CM | POA: Diagnosis not present

## 2020-12-06 DIAGNOSIS — R109 Unspecified abdominal pain: Secondary | ICD-10-CM | POA: Diagnosis not present

## 2020-12-06 DIAGNOSIS — N898 Other specified noninflammatory disorders of vagina: Secondary | ICD-10-CM | POA: Insufficient documentation

## 2020-12-06 LAB — COMPREHENSIVE METABOLIC PANEL
ALT: 22 U/L (ref 0–44)
AST: 17 U/L (ref 15–41)
Albumin: 4.6 g/dL (ref 3.5–5.0)
Alkaline Phosphatase: 102 U/L (ref 38–126)
Anion gap: 7 (ref 5–15)
BUN: 14 mg/dL (ref 6–20)
CO2: 27 mmol/L (ref 22–32)
Calcium: 9.5 mg/dL (ref 8.9–10.3)
Chloride: 104 mmol/L (ref 98–111)
Creatinine, Ser: 0.63 mg/dL (ref 0.44–1.00)
GFR, Estimated: >60 mL/min (ref >60)
Glucose, Bld: 92 mg/dL (ref 70–99)
Potassium: 3.9 mmol/L (ref 3.5–5.1)
Sodium: 138 mmol/L (ref 135–145)
Total Bilirubin: 0.7 mg/dL (ref 0.3–1.2)
Total Protein: 8.2 g/dL — ABNORMAL HIGH (ref 6.5–8.1)

## 2020-12-06 LAB — CBC WITH DIFFERENTIAL/PLATELET
Abs Immature Granulocytes: 0.05 10*3/uL (ref 0.00–0.07)
Basophils Absolute: 0.1 10*3/uL (ref 0.0–0.1)
Basophils Relative: 1 %
Eosinophils Absolute: 0.1 10*3/uL (ref 0.0–0.5)
Eosinophils Relative: 1 %
HCT: 46.2 % — ABNORMAL HIGH (ref 36.0–46.0)
Hemoglobin: 14.7 g/dL (ref 12.0–15.0)
Immature Granulocytes: 1 %
Lymphocytes Relative: 22 %
Lymphs Abs: 2.2 10*3/uL (ref 0.7–4.0)
MCH: 26.4 pg (ref 26.0–34.0)
MCHC: 31.8 g/dL (ref 30.0–36.0)
MCV: 83.1 fL (ref 80.0–100.0)
Monocytes Absolute: 0.6 10*3/uL (ref 0.1–1.0)
Monocytes Relative: 6 %
Neutro Abs: 7.1 10*3/uL (ref 1.7–7.7)
Neutrophils Relative %: 69 %
Platelets: 269 10*3/uL (ref 150–400)
RBC: 5.56 MIL/uL — ABNORMAL HIGH (ref 3.87–5.11)
RDW: 14.8 % (ref 11.5–15.5)
WBC: 10 10*3/uL (ref 4.0–10.5)
nRBC: 0 % (ref 0.0–0.2)

## 2020-12-06 LAB — I-STAT BETA HCG BLOOD, ED (MC, WL, AP ONLY): I-stat hCG, quantitative: <5 m[IU]/mL (ref <5)

## 2020-12-06 NOTE — ED Provider Notes (Addendum)
Emergency Medicine Provider Triage Evaluation Note  [name] , a 30 y.o. female  was evaluated in triage.  Pt complains of vaginal bleeding and abdominal pain.  She states that she has had a vaginal bleeding for 1.5 weeks which is abnormal for her.  She states that it is light bleeding using no more than 1 pad a day.  She states that sometimes bleeding is dark.  She also has had subjective dizziness when she is standing and abdominal pain which she states alternates from the right to the left side.  She describes it as crampy or aching in nature.  Nulliparous.  Endorses irregular periods.  Last menstrual period "months" ago.  She is sexually active, having unprotected sex, states that she is not taking birth control at this time.  Denies urinary symptoms or fever, nausea or vomiting   Review of Systems  Positive: Vaginal bleeding, abdominal pain  Negative: Fever, urinary symptoms  Physical Exam  BP (!) 128/91 (BP Location: Left Arm)   Pulse 87   Temp 98.9 F (37.2 C) (Oral)   Resp 16   Ht 5' 3.75" (1.619 m)   Wt 88.9 kg   SpO2 100%   BMI 33.91 kg/m  Gen:   Awake, no distress   Resp:  Normal effort  MSK:   Moves extremities without difficulty  Other:  +BS, protuberant abdomen, soft and non-tender to palpation   Medical Decision Making  Medically screening exam initiated at 2:42 PM.  Appropriate orders placed.  Jolynne Veldman was informed that the remainder of the evaluation will be completed by another provider, this initial triage assessment does not replace that evaluation, and the importance of remaining in the ED until their evaluation is complete.     Mickie Hillier, PA-C 12/06/20 1445    Mickie Hillier, PA-C 12/06/20 1446    Blanchie Dessert, MD 12/06/20 [phone]

## 2020-12-06 NOTE — ED Triage Notes (Signed)
Pt presents with c/o vaginal discharge that has been going on for one week. Pt reports that the discharge has been bloody in nature, not on her cycle. Pt unsure of when her last cycle was, reports they are very irregular.

## 2020-12-06 NOTE — ED Triage Notes (Signed)
Pt states she has had vaginal bleeding for the past week and this is not her normal menstrual cycle time. Pt also reports intermittent lower abdominal pain.

## 2021-01-15 ENCOUNTER — Emergency Department (HOSPITAL_COMMUNITY)
Admission: EM | Admit: 2021-01-15 | Discharge: 2021-01-16 | Disposition: A | Discharge status: Home / Self Care | Payer: BC Managed Care – PPO | Attending: Emergency Medicine | Admitting: Emergency Medicine

## 2021-01-15 ENCOUNTER — Encounter (HOSPITAL_COMMUNITY): Payer: Self-pay

## 2021-01-15 DIAGNOSIS — R42 Dizziness and giddiness: Secondary | ICD-10-CM | POA: Diagnosis present

## 2021-01-15 DIAGNOSIS — T40711A Poisoning by cannabis, accidental (unintentional), initial encounter: Secondary | ICD-10-CM | POA: Insufficient documentation

## 2021-01-15 DIAGNOSIS — T6591XA Toxic effect of unspecified substance, accidental (unintentional), initial encounter: Secondary | ICD-10-CM

## 2021-01-15 DIAGNOSIS — Z79899 Other long term (current) drug therapy: Secondary | ICD-10-CM | POA: Insufficient documentation

## 2021-01-15 LAB — CBC WITH DIFFERENTIAL/PLATELET
Abs Immature Granulocytes: 0.07 10*3/uL (ref 0.00–0.07)
Basophils Absolute: 0.1 10*3/uL (ref 0.0–0.1)
Basophils Relative: 0 %
Eosinophils Absolute: 0.1 10*3/uL (ref 0.0–0.5)
Eosinophils Relative: 0 %
HCT: 49.2 % — ABNORMAL HIGH (ref 36.0–46.0)
Hemoglobin: 15.6 g/dL — ABNORMAL HIGH (ref 12.0–15.0)
Immature Granulocytes: 0 %
Lymphocytes Relative: 14 %
Lymphs Abs: 2.3 10*3/uL (ref 0.7–4.0)
MCH: 26.2 pg (ref 26.0–34.0)
MCHC: 31.7 g/dL (ref 30.0–36.0)
MCV: 82.6 fL (ref 80.0–100.0)
Monocytes Absolute: 0.7 10*3/uL (ref 0.1–1.0)
Monocytes Relative: 4 %
Neutro Abs: 13.7 10*3/uL — ABNORMAL HIGH (ref 1.7–7.7)
Neutrophils Relative %: 82 %
Platelets: 367 10*3/uL (ref 150–400)
RBC: 5.96 MIL/uL — ABNORMAL HIGH (ref 3.87–5.11)
RDW: 14.6 % (ref 11.5–15.5)
WBC: 16.8 10*3/uL — ABNORMAL HIGH (ref 4.0–10.5)
nRBC: 0 % (ref 0.0–0.2)

## 2021-01-15 LAB — BASIC METABOLIC PANEL
Anion gap: 12 (ref 5–15)
BUN: 13 mg/dL (ref 6–20)
CO2: 21 mmol/L — ABNORMAL LOW (ref 22–32)
Calcium: 9 mg/dL (ref 8.9–10.3)
Chloride: 102 mmol/L (ref 98–111)
Creatinine, Ser: 0.73 mg/dL (ref 0.44–1.00)
GFR, Estimated: >60 mL/min (ref >60)
Glucose, Bld: 140 mg/dL — ABNORMAL HIGH (ref 70–99)
Potassium: 3 mmol/L — ABNORMAL LOW (ref 3.5–5.1)
Sodium: 135 mmol/L (ref 135–145)

## 2021-01-15 LAB — I-STAT BETA HCG BLOOD, ED (MC, WL, AP ONLY): I-stat hCG, quantitative: <5 m[IU]/mL (ref <5)

## 2021-01-15 LAB — RAPID URINE DRUG SCREEN, HOSP PERFORMED
Amphetamines: NOT DETECTED
Barbiturates: NOT DETECTED
Benzodiazepines: NOT DETECTED
Cocaine: NOT DETECTED
Opiates: NOT DETECTED
Tetrahydrocannabinol: POSITIVE — AB

## 2021-01-15 MED ORDER — SODIUM CHLORIDE 0.9 % IV BOLUS
1000.0000 mL | Freq: Once | INTRAVENOUS | Status: AC
  Administered 2021-01-15: 1000 mL via INTRAVENOUS

## 2021-01-15 MED ORDER — POTASSIUM CHLORIDE CRYS ER 20 MEQ PO TBCR
40.0000 meq | EXTENDED_RELEASE_TABLET | Freq: Once | ORAL | Status: AC
  Administered 2021-01-16: 01:00:00 40 meq via ORAL
  Filled 2021-01-15: qty 2

## 2021-01-15 MED ORDER — LORAZEPAM 0.5 MG PO TABS
0.5000 mg | ORAL_TABLET | Freq: Once | ORAL | Status: AC
  Administered 2021-01-15: 0.5 mg via ORAL
  Filled 2021-01-15: qty 1

## 2021-01-15 NOTE — Discharge Instructions (Addendum)
It was a pleasure taking care of you today. As discussed, you were seen due to intoxication from a THC edible. Do not take any further edibles or other drugs. Follow-up with PCP within 1 week. Return to the ER for new or worsening symptoms.

## 2021-01-15 NOTE — ED Triage Notes (Signed)
Pt arrives via EMS. PT reports she ate an edible for the first time and she is now experiencing some anxiety, tachycardia, and dry mouth.

## 2021-01-15 NOTE — ED Provider Notes (Signed)
[facility] DEPT Provider Note   CSN: [id number] Arrival date & time: 01/15/21  1803     History Chief Complaint  Patient presents with   Ingestion    [name] is a 30 y.o. female with a past medical history significant for anxiety, cardiomyopathy, depression, and palpitations who presents to the ED after ingestion of a THC edible around 4PM today.  Patient states she ate half a gummy.  Unknown how many milligrams.  Patient states she has never tried an edible before.  She admits to occasional marijuana use.  No other drug use.  Patient endorses dizziness, dry mouth, palpitations, and some shortness of breath.  Denies history of blood clots, recent surgeries, recent long immobilizations, hormonal treatments, and lower extremity edema.  Denies nausea and vomiting.  No abdominal pain.  Denies chest pain.  No treatment prior to arrival.  No aggravating alleviating factors.  History obtained from patient and past medical records. No interpreter used during encounter.       Past Medical History:  Diagnosis Date   Anxiety    Cardiomyopathy    Depression    Facial angiofibroma    Heart palpitations    Irregular heartbeat    Nosebleed     Patient Active Problem List   Diagnosis Date Noted   Acne rosacea 04/07/2013   Insomnia 04/07/2013   Epistaxis 08/28/2012   Palpitations 06/23/2011   Cardiomyopathy ([place]) 06/09/2010   Preop cardiovascular exam 06/07/2010   Nonspecific abnormal electrocardiogram (ECG) (EKG) 06/07/2010   Chest pain 06/07/2010    Past Surgical History:  Procedure Laterality Date   NASAL HEMORRHAGE CONTROL Bilateral 08/28/2012   Procedure: EPISTAXIS CONTROL;  Surgeon: Jerrell Belfast, MD;  Location: [facility] OR;  Service: ENT;  Laterality: Bilateral;     OB History   No obstetric history on file.     Family History  Problem Relation Age of Onset   Heart disease Father        Unknown type    Social History   Tobacco  Use   Smoking status: Never   Smokeless tobacco: Never  Substance Use Topics   Alcohol use: No   Drug use: No    Home Medications Prior to Admission medications   Medication Sig Start Date End Date Taking? Authorizing Provider  citalopram (CELEXA) 20 MG tablet Take 1 tablet (20 mg total) by mouth daily. 12/22/19   Just, Laurita Quint, FNP  hydrOXYzine (VISTARIL) 25 MG capsule Take 1 capsule (25 mg total) by mouth 3 (three) times daily as needed for anxiety. 10/30/19   Rozetta Nunnery, NP  Melatonin 3 MG CAPS Take 1 capsule (3 mg total) by mouth at bedtime as needed. Patient not taking: Reported on 05/30/2016 12/03/15   Barrett, Evelene Croon, PA-C  norgestrel-ethinyl estradiol (LO/OVRAL,CRYSELLE) 0.3-30 MG-MCG tablet Take 1 tablet by mouth daily. Patient not taking: Reported on 11/14/2017 06/09/16   Emily Filbert, MD    Allergies    Patient has no known allergies.  Review of Systems   Review of Systems  Constitutional:  Negative for chills and fever.  Respiratory:  Positive for shortness of breath.   Cardiovascular:  Positive for palpitations. Negative for chest pain and leg swelling.  Gastrointestinal:  Negative for abdominal pain, diarrhea and vomiting.  Neurological:  Positive for dizziness.  All other systems reviewed and are negative.  Physical Exam Updated Vital Signs BP (!) 140/95    Pulse (!) 117    Temp 98.2 F (36.8  C)    Resp 20    Ht 5\' 2"  (1.575 m)    Wt 88.9 kg    SpO2 97%    BMI 35.85 kg/m   Physical Exam Vitals and nursing note reviewed.  Constitutional:      General: She is not in acute distress.    Appearance: She is not ill-appearing.  HENT:     Head: Normocephalic.  Eyes:     Pupils: Pupils are equal, round, and reactive to light.  Cardiovascular:     Rate and Rhythm: Regular rhythm. Tachycardia present.     Pulses: Normal pulses.     Heart sounds: Normal heart sounds. No murmur heard.   No friction rub. No gallop.  Pulmonary:     Effort: Pulmonary effort is  normal.     Breath sounds: Normal breath sounds.  Abdominal:     General: Abdomen is flat. There is no distension.     Palpations: Abdomen is soft.     Tenderness: There is no abdominal tenderness. There is no guarding or rebound.  Musculoskeletal:        General: Normal range of motion.     Cervical back: Neck supple.  Skin:    General: Skin is warm and dry.  Neurological:     General: No focal deficit present.     Mental Status: She is alert.     Comments: Speech is clear, able to follow commands CN III-XII intact Normal strength in upper and lower extremities bilaterally including dorsiflexion and plantar flexion, strong and equal grip strength Sensation grossly intact throughout Moves extremities without ataxia, coordination intact No pronator drift Ambulates without difficulty  Psychiatric:        Mood and Affect: Mood normal.        Behavior: Behavior normal.    ED Results / Procedures / Treatments   Labs (all labs ordered are listed, but only abnormal results are displayed) Labs Reviewed  BASIC METABOLIC PANEL - Abnormal; Notable for the following components:      Result Value   Potassium 3.0 (*)    CO2 21 (*)    Glucose, Bld 140 (*)    All other components within normal limits  CBC WITH DIFFERENTIAL/PLATELET - Abnormal; Notable for the following components:   WBC 16.8 (*)    RBC 5.96 (*)    Hemoglobin 15.6 (*)    HCT 49.2 (*)    Neutro Abs 13.7 (*)    All other components within normal limits  RAPID URINE DRUG SCREEN, HOSP PERFORMED - Abnormal; Notable for the following components:   Tetrahydrocannabinol POSITIVE (*)    All other components within normal limits  I-STAT BETA HCG BLOOD, ED ([facility], [facility], [facility] ONLY)    EKG EKG Interpretation  Date/Time:  Saturday January 15 2021 18:30:03 EST Ventricular Rate:  126 PR Interval:  156 QRS Duration: 76 QT Interval:  301 QTC Calculation: 436 R Axis:   84 Text Interpretation: Sinus tachycardia Nonspecific T  abnormalities, diffuse leads Confirmed by Sherwood Gambler [phone]) on 01/15/2021 7:23:36 PM  Radiology No results found.  Procedures Procedures   Medications Ordered in ED Medications  potassium chloride SA (KLOR-CON M) CR tablet 40 mEq (has no administration in time range)  sodium chloride 0.9 % bolus 1,000 mL (0 mLs Intravenous Stopped 01/15/21 2340)  LORazepam (ATIVAN) tablet 0.5 mg (0.5 mg Oral Given 01/15/21 1941)  sodium chloride 0.9 % bolus 1,000 mL (1,000 mLs Intravenous New Bag/Given 01/15/21 2344)    ED  Course  I have reviewed the triage vital signs and the nursing notes.  Pertinent labs & imaging results that were available during my care of the patient were reviewed by me and considered in my medical decision making (see chart for details).    MDM Rules/Calculators/A&P                         30 year old female presents to the ED after ingestion of a THC edible around 4 PM.  Patient endorses occasional marijuana use.  No other drugs.  Patient admits to dizziness, shortness of breath, dry mouth, and palpitations.  Upon arrival, patient tachycardic at 141.  No hypoxia or fever.  Patient in no acute distress.  Benign physical exam.  Normal neurological exam.  No lower extremity edema.  No calf tenderness.  Low suspicion for PE/DVT. No infectious symptoms to suggest infectious etiology of tachycardia. Suspect tachycardia related to ingestion of THC gummy.  Routine labs ordered in triage.  Will give IV fluids and Ativan and observe patient for symptomatic relief.  CBC significant for leukocytosis at 16.8 with elevated hemoglobin.  Likely due to hemoconcentration?  BMP significant for hypokalemia at 3. Potassium repleted here in the ED. Hyperglycemia 140.  No anion gap.  Normal renal function.  Negative pregnancy test.  UDS positive for THC.  EKG demonstrates sinus tachycardia.  No signs of acute ischemia.  11:57 PM reassessed patient. Patient resting comfortably in bed. Still  endorses dry mouth and dizziness. HR ranges from 110-115. IVFs still going. Will continue to monitor.  Patient handed off to Quincy Carnes, PA-C at shift change pending metabolism of THC. Once HR has improved, patient may be discharged home.   Final Clinical Impression(s) / ED Diagnoses Final diagnoses:  Ingestion of substance, accidental or unintentional, initial encounter    Rx / DC Orders ED Discharge Orders     None        Suzy Bouchard, PA-C 01/16/21 0000    Sherwood Gambler, MD 01/18/21 [phone]

## 2021-01-15 NOTE — ED Provider Notes (Signed)
Emergency Medicine Provider Triage Evaluation Note  [name] , a 30 y.o. female  was evaluated in triage.  Pt complains of lightheadedness that started after she took an edible. Denies other drug or etoh use.  Review of Systems  Positive: lightheadedness Negative: Chest pain  Physical Exam  BP (!) 128/99    Pulse (!) 141    Temp 98.2 F (36.8 C)    Resp 19    SpO2 99%  Gen:   Awake, no distress   Resp:  Normal effort  MSK:   Moves extremities without difficulty  Other:  tachycardic  Medical Decision Making  Medically screening exam initiated at 6:43 PM.  Appropriate orders placed.  [name] was informed that the remainder of the evaluation will be completed by another provider, this initial triage assessment does not replace that evaluation, and the importance of remaining in the ED until their evaluation is complete.     Bishop Dublin 01/15/21 1845    Carmin Muskrat, MD 01/15/21 2227

## 2021-01-16 NOTE — ED Notes (Signed)
Pt NAD, a/ox4. Pt states she ate an edible for the first time and apparently had too much. Denies SI/HI or ETOH.

## 2021-01-16 NOTE — ED Notes (Signed)
Pt ambulates to restroom with steady gait.

## 2021-01-16 NOTE — ED Notes (Signed)
Pt NAD, a/ox4. Pt verbalizes understanding of all DC and f/u instructions. All questions answered. Pt walks with steady gait to lobby at DC.  ? ?

## 2021-01-16 NOTE — ED Provider Notes (Signed)
Hr improved, now 112 (was ~150 time of arrival).  She is AAOx3, ambulatory, tolerating PO.  Feel she is stable for discharge home.  Encouraged to refrain from illicit drug use.  Can follow-up with PCP.  Return here for new concerns.   Larene Pickett, PA-C 01/16/21 Natasha Mead    Veryl Speak, MD 01/16/21 [phone]

## 2021-01-16 NOTE — ED Notes (Signed)
Pt has ambulated to and from the bathroom with a steady gait. She has tolerated water PO without vomiting. A&Ox4.

## 2021-01-18 ENCOUNTER — Emergency Department (HOSPITAL_COMMUNITY)
Admission: EM | Admit: 2021-01-18 | Discharge: 2021-01-18 | Disposition: A | Discharge status: Home / Self Care | Payer: BC Managed Care – PPO | Attending: Emergency Medicine | Admitting: Emergency Medicine

## 2021-01-18 ENCOUNTER — Other Ambulatory Visit: Payer: Self-pay

## 2021-01-18 ENCOUNTER — Encounter (HOSPITAL_COMMUNITY): Payer: Self-pay

## 2021-01-18 DIAGNOSIS — N9489 Other specified conditions associated with female genital organs and menstrual cycle: Secondary | ICD-10-CM | POA: Insufficient documentation

## 2021-01-18 DIAGNOSIS — F419 Anxiety disorder, unspecified: Secondary | ICD-10-CM | POA: Diagnosis present

## 2021-01-18 DIAGNOSIS — Z20822 Contact with and (suspected) exposure to covid-19: Secondary | ICD-10-CM | POA: Insufficient documentation

## 2021-01-18 DIAGNOSIS — Z79899 Other long term (current) drug therapy: Secondary | ICD-10-CM | POA: Diagnosis not present

## 2021-01-18 LAB — CBC WITH DIFFERENTIAL/PLATELET
Abs Immature Granulocytes: 0.06 10*3/uL (ref 0.00–0.07)
Basophils Absolute: 0.1 10*3/uL (ref 0.0–0.1)
Basophils Relative: 1 %
Eosinophils Absolute: 0.1 10*3/uL (ref 0.0–0.5)
Eosinophils Relative: 1 %
HCT: 45.6 % (ref 36.0–46.0)
Hemoglobin: 14.7 g/dL (ref 12.0–15.0)
Immature Granulocytes: 0 %
Lymphocytes Relative: 20 %
Lymphs Abs: 2.7 10*3/uL (ref 0.7–4.0)
MCH: 26.7 pg (ref 26.0–34.0)
MCHC: 32.2 g/dL (ref 30.0–36.0)
MCV: 82.8 fL (ref 80.0–100.0)
Monocytes Absolute: 0.8 10*3/uL (ref 0.1–1.0)
Monocytes Relative: 6 %
Neutro Abs: 9.6 10*3/uL — ABNORMAL HIGH (ref 1.7–7.7)
Neutrophils Relative %: 72 %
Platelets: 291 10*3/uL (ref 150–400)
RBC: 5.51 MIL/uL — ABNORMAL HIGH (ref 3.87–5.11)
RDW: 14.5 % (ref 11.5–15.5)
WBC: 13.4 10*3/uL — ABNORMAL HIGH (ref 4.0–10.5)
nRBC: 0 % (ref 0.0–0.2)

## 2021-01-18 LAB — RAPID URINE DRUG SCREEN, HOSP PERFORMED
Amphetamines: NOT DETECTED
Barbiturates: NOT DETECTED
Benzodiazepines: NOT DETECTED
Cocaine: NOT DETECTED
Opiates: NOT DETECTED
Tetrahydrocannabinol: POSITIVE — AB

## 2021-01-18 LAB — COMPREHENSIVE METABOLIC PANEL
ALT: 17 U/L (ref 0–44)
AST: 15 U/L (ref 15–41)
Albumin: 4.3 g/dL (ref 3.5–5.0)
Alkaline Phosphatase: 101 U/L (ref 38–126)
Anion gap: 11 (ref 5–15)
BUN: 15 mg/dL (ref 6–20)
CO2: 24 mmol/L (ref 22–32)
Calcium: 9.4 mg/dL (ref 8.9–10.3)
Chloride: 99 mmol/L (ref 98–111)
Creatinine, Ser: 0.64 mg/dL (ref 0.44–1.00)
GFR, Estimated: >60 mL/min (ref >60)
Glucose, Bld: 97 mg/dL (ref 70–99)
Potassium: 3.4 mmol/L — ABNORMAL LOW (ref 3.5–5.1)
Sodium: 134 mmol/L — ABNORMAL LOW (ref 135–145)
Total Bilirubin: 0.8 mg/dL (ref 0.3–1.2)
Total Protein: 8.1 g/dL (ref 6.5–8.1)

## 2021-01-18 LAB — RESP PANEL BY RT-PCR (FLU A&B, COVID) ARPGX2
Influenza A by PCR: NEGATIVE
Influenza B by PCR: NEGATIVE
SARS Coronavirus 2 by RT PCR: NEGATIVE

## 2021-01-18 LAB — I-STAT BETA HCG BLOOD, ED (MC, WL, AP ONLY): I-stat hCG, quantitative: <5 m[IU]/mL (ref <5)

## 2021-01-18 MED ORDER — HYDROXYZINE HCL 25 MG PO TABS: 25.0000 mg | ORAL_TABLET | Freq: Four times a day (QID) | ORAL | 0 refills | Status: DC

## 2021-01-18 MED ORDER — LORAZEPAM 1 MG PO TABS
1.0000 mg | ORAL_TABLET | Freq: Once | ORAL | Status: AC
  Administered 2021-01-18: 07:00:00 1 mg via ORAL
  Filled 2021-01-18: qty 1

## 2021-01-18 NOTE — ED Provider Notes (Signed)
[facility] DEPT Provider Note   CSN: [id number] Arrival date & time: 01/18/21  0402     History Chief Complaint  Patient presents with   Anxiety    [name] is a 30 y.o. female with a past medical history significant for anxiety, depression, and history of palpitations who presents to the ED due to concerns about a panic attack.  Patient states she woke up at 3 AM with palpitations and numbness/tingling to bilateral hands.  She notes she felt like she was having a panic attack.  Patient told triage nurse that her father died while laying down and she now feels anxious when lying down.  Patient states her symptoms have completely resolved.  Patient sees a therapist regularly however, is not currently on any anxiety medication.  Patient denies chest pain, palpitations, abdominal pain, nausea, and vomiting.  Denies SI, HI, and auditory/visual hallucinations.  I personally saw patient 3 days ago for ingestion of THC gummy. Denies further ingestion of edibles. No treatment prior to arrival.  No aggravating or alleviating factors.  History obtained from patient and past medical records. No interpreter used during encounter.       Past Medical History:  Diagnosis Date   Anxiety    Cardiomyopathy    Depression    Facial angiofibroma    Heart palpitations    Irregular heartbeat    Nosebleed     Patient Active Problem List   Diagnosis Date Noted   Acne rosacea 04/07/2013   Insomnia 04/07/2013   Epistaxis 08/28/2012   Palpitations 06/23/2011   Cardiomyopathy ([place]) 06/09/2010   Preop cardiovascular exam 06/07/2010   Nonspecific abnormal electrocardiogram (ECG) (EKG) 06/07/2010   Chest pain 06/07/2010    Past Surgical History:  Procedure Laterality Date   NASAL HEMORRHAGE CONTROL Bilateral 08/28/2012   Procedure: EPISTAXIS CONTROL;  Surgeon: Jerrell Belfast, MD;  Location: [facility] OR;  Service: ENT;  Laterality: Bilateral;     OB History   No  obstetric history on file.     Family History  Problem Relation Age of Onset   Heart disease Father        Unknown type    Social History   Tobacco Use   Smoking status: Never   Smokeless tobacco: Never  Substance Use Topics   Alcohol use: No   Drug use: No    Home Medications Prior to Admission medications   Medication Sig Start Date End Date Taking? Authorizing Provider  hydrOXYzine (ATARAX) 25 MG tablet Take 1 tablet (25 mg total) by mouth every 6 (six) hours. 01/18/21  Yes Ariann Khaimov, Druscilla Brownie, PA-C  citalopram (CELEXA) 20 MG tablet Take 1 tablet (20 mg total) by mouth daily. 12/22/19   Just, Laurita Quint, FNP  hydrOXYzine (VISTARIL) 25 MG capsule Take 1 capsule (25 mg total) by mouth 3 (three) times daily as needed for anxiety. 10/30/19   Rozetta Nunnery, NP  Melatonin 3 MG CAPS Take 1 capsule (3 mg total) by mouth at bedtime as needed. Patient not taking: Reported on 05/30/2016 12/03/15   Barrett, Evelene Croon, PA-C  norgestrel-ethinyl estradiol (LO/OVRAL,CRYSELLE) 0.3-30 MG-MCG tablet Take 1 tablet by mouth daily. Patient not taking: Reported on 11/14/2017 06/09/16   Emily Filbert, MD    Allergies    Patient has no known allergies.  Review of Systems   Review of Systems  Constitutional:  Negative for chills and fever.  Respiratory:  Negative for shortness of breath.   Cardiovascular:  Positive for palpitations (  resolved). Negative for chest pain.  Gastrointestinal:  Negative for abdominal pain, nausea and vomiting.  Psychiatric/Behavioral:  Negative for self-injury and suicidal ideas. The patient is nervous/anxious.   All other systems reviewed and are negative.  Physical Exam Updated Vital Signs BP 118/83    Pulse 82    Temp 98.7 F (37.1 C) (Oral)    Resp (!) 22    Ht 5\' 3"  (1.6 m)    Wt 88.9 kg    SpO2 99%    BMI 34.72 kg/m   Physical Exam Vitals and nursing note reviewed.  Constitutional:      General: She is not in acute distress.    Appearance: She is not  ill-appearing.  HENT:     Head: Normocephalic.  Eyes:     Pupils: Pupils are equal, round, and reactive to light.  Cardiovascular:     Rate and Rhythm: Normal rate and regular rhythm.     Pulses: Normal pulses.     Heart sounds: Normal heart sounds. No murmur heard.   No friction rub. No gallop.  Pulmonary:     Effort: Pulmonary effort is normal.     Breath sounds: Normal breath sounds.  Abdominal:     General: Abdomen is flat. There is no distension.     Palpations: Abdomen is soft.     Tenderness: There is no abdominal tenderness. There is no guarding or rebound.  Musculoskeletal:        General: Normal range of motion.     Cervical back: Neck supple.  Skin:    General: Skin is warm and dry.  Neurological:     General: No focal deficit present.     Mental Status: She is alert.  Psychiatric:        Mood and Affect: Mood normal.        Behavior: Behavior normal.    ED Results / Procedures / Treatments   Labs (all labs ordered are listed, but only abnormal results are displayed) Labs Reviewed  COMPREHENSIVE METABOLIC PANEL - Abnormal; Notable for the following components:      Result Value   Sodium 134 (*)    Potassium 3.4 (*)    All other components within normal limits  RAPID URINE DRUG SCREEN, HOSP PERFORMED - Abnormal; Notable for the following components:   Tetrahydrocannabinol POSITIVE (*)    All other components within normal limits  CBC WITH DIFFERENTIAL/PLATELET - Abnormal; Notable for the following components:   WBC 13.4 (*)    RBC 5.51 (*)    Neutro Abs 9.6 (*)    All other components within normal limits  RESP PANEL BY RT-PCR (FLU A&B, COVID) ARPGX2  ETHANOL  ACETAMINOPHEN LEVEL  SALICYLATE LEVEL  I-STAT BETA HCG BLOOD, ED ([facility], [facility], [facility] ONLY)    EKG None  Radiology No results found.  Procedures Procedures   Medications Ordered in ED Medications  LORazepam (ATIVAN) tablet 1 mg (1 mg Oral Given 01/18/21 9024)    ED Course  I have reviewed  the triage vital signs and the nursing notes.  Pertinent labs & imaging results that were available during my care of the patient were reviewed by me and considered in my medical decision making (see chart for details).  Clinical Course as of 01/18/21 0844  Tue Jan 18, 2021  0973 Tetrahydrocannabinol(!): POSITIVE [CA]    Clinical Course User Index [CA] Karie Kirks   MDM Rules/Calculators/A&P  30 year old female presents to the ED due to concerns about an anxiety attack that occurred around 3 AM.  Patient states she had palpitations and numbness/tingling to bilateral hands which has completely resolved upon arrival.  No SI, HI, and auditory/visual hallucinations.  Patient sees a therapist regularly however, is not currently on any anxiety medications.  Upon arrival, triage noted patient to be tachycardic at 107 however, patient waited roughly 3 hours prior to initial evaluation where her heart rate normalized.  Patient states symptoms have completely resolved.  Medical clearance labs ordered.  Ativan given.  Will reassess for need for TTS evaluation versus discharge home with outpatient resources pending patient's symptoms.  CBC significant for mild leukocytosis at 13.4.  Normal hemoglobin.  CMP reassuring.  Normal renal function.  Mild hypokalemia 3.4.  COVID/influenza negative.  UDS positive for THC.  Pregnancy test negative.  EKG demonstrates normal sinus rhythm no signs of acute ischemia.  8:40 AM reassessed patient at bedside who notes continued resolution in symptoms.  She notes she no longer feels like she is having a panic attack.  Patient desires discharge home with outpatient resources.  Denies SI, HI, and auditory/visual donations.  No evidence of psychosis.  Patient declined TTS evaluation.  Patient discharged with as needed Vistaril.  Advised patient to follow-up with her therapist within the next few days.  Outpatient resources given to patient.   Strict ED precautions discussed with patient. Patient states understanding and agrees to plan. Patient discharged home in no acute distress and stable vitals    Final Clinical Impression(s) / ED Diagnoses Final diagnoses:  Anxiety    Rx / DC Orders ED Discharge Orders          Ordered    hydrOXYzine (ATARAX) 25 MG tablet  Every 6 hours        01/18/21 0825             Suzy Bouchard, PA-C 01/18/21 0845    Sherwood Gambler, MD 01/18/21 [phone]

## 2021-01-18 NOTE — Discharge Instructions (Signed)
It was a pleasure taking care of you today.  As discussed, I am sending you home with an as needed anxiety medication.  Please follow-up with your therapist within the next few days.  Return to the ER for new or worsening symptoms.  I have also included outpatient resources for follow-up.

## 2021-01-18 NOTE — ED Triage Notes (Signed)
Patient BIB EMS with complaint of anxiety. Pt reports her father died while laying down and states when she lays down she often becomes anxious.   BP 120 palpated HR 110  RR 14 SPO2 98% RA

## 2021-01-19 ENCOUNTER — Other Ambulatory Visit: Payer: Self-pay

## 2021-01-19 ENCOUNTER — Encounter (HOSPITAL_BASED_OUTPATIENT_CLINIC_OR_DEPARTMENT_OTHER): Payer: Self-pay

## 2021-01-19 ENCOUNTER — Emergency Department (HOSPITAL_BASED_OUTPATIENT_CLINIC_OR_DEPARTMENT_OTHER)
Admission: EM | Admit: 2021-01-19 | Discharge: 2021-01-19 | Disposition: A | Discharge status: Home / Self Care | Payer: BC Managed Care – PPO | Attending: Emergency Medicine | Admitting: Emergency Medicine

## 2021-01-19 DIAGNOSIS — R002 Palpitations: Secondary | ICD-10-CM | POA: Insufficient documentation

## 2021-01-19 LAB — CBC WITH DIFFERENTIAL/PLATELET
Abs Immature Granulocytes: 0.05 10*3/uL (ref 0.00–0.07)
Basophils Absolute: 0 10*3/uL (ref 0.0–0.1)
Basophils Relative: 0 %
Eosinophils Absolute: 0 10*3/uL (ref 0.0–0.5)
Eosinophils Relative: 0 %
HCT: 50.1 % — ABNORMAL HIGH (ref 36.0–46.0)
Hemoglobin: 16.1 g/dL — ABNORMAL HIGH (ref 12.0–15.0)
Immature Granulocytes: 1 %
Lymphocytes Relative: 14 %
Lymphs Abs: 1.6 10*3/uL (ref 0.7–4.0)
MCH: 26.2 pg (ref 26.0–34.0)
MCHC: 32.1 g/dL (ref 30.0–36.0)
MCV: 81.6 fL (ref 80.0–100.0)
Monocytes Absolute: 0.5 10*3/uL (ref 0.1–1.0)
Monocytes Relative: 4 %
Neutro Abs: 8.8 10*3/uL — ABNORMAL HIGH (ref 1.7–7.7)
Neutrophils Relative %: 81 %
Platelets: 313 10*3/uL (ref 150–400)
RBC: 6.14 MIL/uL — ABNORMAL HIGH (ref 3.87–5.11)
RDW: 14.5 % (ref 11.5–15.5)
WBC: 10.9 10*3/uL — ABNORMAL HIGH (ref 4.0–10.5)
nRBC: 0 % (ref 0.0–0.2)

## 2021-01-19 LAB — BASIC METABOLIC PANEL
Anion gap: 11 (ref 5–15)
BUN: 11 mg/dL (ref 6–20)
CO2: 28 mmol/L (ref 22–32)
Calcium: 10.7 mg/dL — ABNORMAL HIGH (ref 8.9–10.3)
Chloride: 98 mmol/L (ref 98–111)
Creatinine, Ser: 0.76 mg/dL (ref 0.44–1.00)
GFR, Estimated: >60 mL/min (ref >60)
Glucose, Bld: 82 mg/dL (ref 70–99)
Potassium: 3.5 mmol/L (ref 3.5–5.1)
Sodium: 137 mmol/L (ref 135–145)

## 2021-01-19 LAB — MAGNESIUM: Magnesium: 2.1 mg/dL (ref 1.7–2.4)

## 2021-01-19 LAB — TSH: TSH: 0.628 u[IU]/mL (ref 0.350–4.500)

## 2021-01-19 MED ORDER — SODIUM CHLORIDE 0.9 % IV BOLUS
1000.0000 mL | Freq: Once | INTRAVENOUS | Status: AC
  Administered 2021-01-19: 18:00:00 1000 mL via INTRAVENOUS

## 2021-01-19 NOTE — Discharge Instructions (Signed)
Please call one of the primary care providers that are listed in your paperwork and set up an appointment to establish care. Please follow up on your TSH result that is still pending by using MyChart. I have also provided a resource guide for financial assistance.

## 2021-01-19 NOTE — ED Provider Notes (Signed)
[facility] EMERGENCY DEPT Provider Note   CSN: [id number] Arrival date & time: 01/19/21  1613     History Chief Complaint  Patient presents with   Tachycardia   Hypertension    [name] is a 30 y.o. female. Patient presents with chief complaint of palpitations that started earlier today while she was working.  She checked her heart rate at that time and realized that it was 130 and her blood pressure is elevated.  She does not remember being specifically anxious prior to this event, however she does feel like that she has been overthinking lately.  She denies any caffeine use, or drug use today.  She denies any chest pain, shortness of breath, nausea, or vomiting.  Her symptoms are better at the moment.  She was seen in the emergency department 2 other times over the past week for similar symptoms.  Her work-up has been largely unremarkable except for some electrolyte abnormalities.  She was seen at [facility] and was prescribed Atarax, however she was not able to pick this up due to financial reasons.  She is not established with primary care.   Hypertension Pertinent negatives include no chest pain, no abdominal pain and no shortness of breath.      Past Medical History:  Diagnosis Date   Anxiety    Cardiomyopathy    Depression    Facial angiofibroma    Heart palpitations    Irregular heartbeat    Nosebleed     Patient Active Problem List   Diagnosis Date Noted   Acne rosacea 04/07/2013   Insomnia 04/07/2013   Epistaxis 08/28/2012   Palpitations 06/23/2011   Cardiomyopathy ([place]) 06/09/2010   Preop cardiovascular exam 06/07/2010   Nonspecific abnormal electrocardiogram (ECG) (EKG) 06/07/2010   Chest pain 06/07/2010    Past Surgical History:  Procedure Laterality Date   NASAL HEMORRHAGE CONTROL Bilateral 08/28/2012   Procedure: EPISTAXIS CONTROL;  Surgeon: Jerrell Belfast, MD;  Location: [facility] OR;  Service: ENT;  Laterality:  Bilateral;     OB History   No obstetric history on file.     Family History  Problem Relation Age of Onset   Heart disease Father        Unknown type    Social History   Tobacco Use   Smoking status: Never   Smokeless tobacco: Never  Substance Use Topics   Alcohol use: No   Drug use: No    Home Medications Prior to Admission medications   Medication Sig Start Date End Date Taking? Authorizing Provider  citalopram (CELEXA) 20 MG tablet Take 1 tablet (20 mg total) by mouth daily. 12/22/19   Just, Laurita Quint, FNP  hydrOXYzine (ATARAX) 25 MG tablet Take 1 tablet (25 mg total) by mouth every 6 (six) hours. 01/18/21   Suzy Bouchard, PA-C  hydrOXYzine (VISTARIL) 25 MG capsule Take 1 capsule (25 mg total) by mouth 3 (three) times daily as needed for anxiety. 10/30/19   Rozetta Nunnery, NP  Melatonin 3 MG CAPS Take 1 capsule (3 mg total) by mouth at bedtime as needed. Patient not taking: Reported on 05/30/2016 12/03/15   Barrett, Evelene Croon, PA-C  norgestrel-ethinyl estradiol (LO/OVRAL,CRYSELLE) 0.3-30 MG-MCG tablet Take 1 tablet by mouth daily. Patient not taking: Reported on 11/14/2017 06/09/16   Emily Filbert, MD    Allergies    Patient has no known allergies.  Review of Systems   Review of Systems  Respiratory:  Negative for shortness of breath.  Cardiovascular:  Positive for palpitations. Negative for chest pain.  Gastrointestinal:  Negative for abdominal pain, nausea and vomiting.  All other systems reviewed and are negative.  Physical Exam Updated Vital Signs BP (!) 139/97    Pulse (!) 102    Temp 98.4 F (36.9 C) (Oral)    Resp 10    Ht 5\' 3"  (1.6 m)    Wt 86.2 kg    LMP 06/30/2020 (Approximate)    SpO2 100%    BMI 33.66 kg/m   Physical Exam Vitals and nursing note reviewed.  Constitutional:      General: She is not in acute distress.    Appearance: Normal appearance. She is well-developed. She is not ill-appearing, toxic-appearing or diaphoretic.  HENT:      Head: Normocephalic and atraumatic.     Nose: No nasal deformity.     Mouth/Throat:     Lips: Pink. No lesions.     Mouth: Mucous membranes are moist. No injury, lacerations, oral lesions or angioedema.     Pharynx: Oropharynx is clear. Uvula midline. No pharyngeal swelling, oropharyngeal exudate, posterior oropharyngeal erythema or uvula swelling.  Eyes:     General: Gaze aligned appropriately. No scleral icterus.       Right eye: No discharge.        Left eye: No discharge.     Conjunctiva/sclera: Conjunctivae normal.     Right eye: Right conjunctiva is not injected. No exudate or hemorrhage.    Left eye: Left conjunctiva is not injected. No exudate or hemorrhage. Cardiovascular:     Rate and Rhythm: Normal rate and regular rhythm.     Pulses: Normal pulses.          Radial pulses are 2+ on the right side and 2+ on the left side.       Dorsalis pedis pulses are 2+ on the right side and 2+ on the left side.     Heart sounds: Normal heart sounds, S1 normal and S2 normal. Heart sounds not distant. No murmur heard.   No friction rub. No gallop. No S3 or S4 sounds.  Pulmonary:     Effort: Pulmonary effort is normal. No accessory muscle usage or respiratory distress.     Breath sounds: Normal breath sounds. No stridor. No wheezing, rhonchi or rales.  Chest:     Chest wall: No tenderness.  Abdominal:     General: Abdomen is flat. Bowel sounds are normal. There is no distension.     Palpations: Abdomen is soft. There is no mass or pulsatile mass.     Tenderness: There is no abdominal tenderness. There is no guarding or rebound.  Musculoskeletal:     Right lower leg: No edema.     Left lower leg: No edema.  Skin:    General: Skin is warm and dry.     Coloration: Skin is not jaundiced or pale.     Findings: No bruising, erythema, lesion or rash.  Neurological:     General: No focal deficit present.     Mental Status: She is alert and oriented to person, place, and time.     GCS: GCS  eye subscore is 4. GCS verbal subscore is 5. GCS motor subscore is 6.  Psychiatric:        Mood and Affect: Mood normal.        Speech: Speech normal.        Behavior: Behavior normal. Behavior is cooperative.    ED Results / Procedures /  Treatments   Labs (all labs ordered are listed, but only abnormal results are displayed) Labs Reviewed  BASIC METABOLIC PANEL - Abnormal; Notable for the following components:      Result Value   Calcium 10.7 (*)    All other components within normal limits  CBC WITH DIFFERENTIAL/PLATELET - Abnormal; Notable for the following components:   WBC 10.9 (*)    RBC 6.14 (*)    Hemoglobin 16.1 (*)    HCT 50.1 (*)    Neutro Abs 8.8 (*)    All other components within normal limits  MAGNESIUM  TSH    EKG EKG Interpretation  Date/Time:  Wednesday January 19 2021 16:28:02 EST Ventricular Rate:  124 PR Interval:  166 QRS Duration: 79 QT Interval:  311 QTC Calculation: 447 R Axis:   51 Text Interpretation: Sinus tachycardia Borderline T abnormalities, diffuse leads No significant change since last tracing Other than the sinus tachycardia. Confirmed by Fredia Sorrow [phone]) on 01/19/2021 4:30:27 PM  Radiology No results found.  Procedures Procedures   Medications Ordered in ED Medications  sodium chloride 0.9 % bolus 1,000 mL (1,000 mLs Intravenous New Bag/Given 01/19/21 1815)    ED Course  I have reviewed the triage vital signs and the nursing notes.  Pertinent labs & imaging results that were available during my care of the patient were reviewed by me and considered in my medical decision making (see chart for details).    MDM Rules/Calculators/A&P                          This is a 30 year old female with a past medical history of irregular heartbeat, cardiomyopathy, and palpitations.  He presents today to the emergency department with complaints of palpitations that occurred this afternoon with elevated blood pressure with systolic  up to 185 and elevated heart rate up to 130.  The symptoms occurred at rest.  On chart review, patient has been seen 2 times in the past week for similar symptoms.  It was initially thought this was related to an edible that she took prior to this, however patient has not taken any further edibles since then.  Second time she was here the symptoms were attributed to by anxiety, however she was prescribed Atarax to go home with, and she was unable to get this filled due to financial concerns.  On arrival, heart rate is initially 121, however by the time I saw patient was down to 104.  She is not remarkably hypertensive.  Blood pressure is 139/94.  All of her other vitals are hemodynamically stable.  Will obtain basic work-up, TSH, and mag level. Labs overall improved over the past several days.  There is some signs of mild hemoconcentration.  Fluids will help this.  Heart rate has been sustained around 100-105.  Blood pressure in okay range.  I feel patient would most benefit from primary care follow-up.  We will offer her resources to set up primary care for low income and uninsured patient.      Final Clinical Impression(s) / ED Diagnoses Final diagnoses:  Palpitations    Rx / DC Orders ED Discharge Orders     None        Adolphus Birchwood, PA-C 01/19/21 1924    Fredia Sorrow, MD 01/20/21 1723

## 2021-01-19 NOTE — ED Triage Notes (Signed)
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 Pt checked and noted her HR was 130 and B/P was elevated. Pt denies chest pain or ShOB. Pt seen for same on 12/17 and 12/20.

## 2021-01-19 NOTE — ED Notes (Signed)
S1 S2 heard. Lung sounds clear. Pt stated that she did not have the money to fill the prescription from [employer]

## 2021-01-20 ENCOUNTER — Encounter (HOSPITAL_COMMUNITY): Payer: Self-pay | Admitting: Emergency Medicine

## 2021-01-20 ENCOUNTER — Emergency Department (HOSPITAL_COMMUNITY)
Admission: EM | Admit: 2021-01-20 | Discharge: 2021-01-20 | Disposition: A | Discharge status: Home / Self Care | Payer: BC Managed Care – PPO | Attending: Emergency Medicine | Admitting: Emergency Medicine

## 2021-01-20 ENCOUNTER — Other Ambulatory Visit: Payer: Self-pay

## 2021-01-20 DIAGNOSIS — R682 Dry mouth, unspecified: Secondary | ICD-10-CM | POA: Insufficient documentation

## 2021-01-20 DIAGNOSIS — R202 Paresthesia of skin: Secondary | ICD-10-CM | POA: Diagnosis present

## 2021-01-20 LAB — CBC WITH DIFFERENTIAL/PLATELET
Abs Immature Granulocytes: 0.04 10*3/uL (ref 0.00–0.07)
Basophils Absolute: 0 10*3/uL (ref 0.0–0.1)
Basophils Relative: 0 %
Eosinophils Absolute: 0.1 10*3/uL (ref 0.0–0.5)
Eosinophils Relative: 1 %
HCT: 46 % (ref 36.0–46.0)
Hemoglobin: 14.9 g/dL (ref 12.0–15.0)
Immature Granulocytes: 0 %
Lymphocytes Relative: 18 %
Lymphs Abs: 1.9 10*3/uL (ref 0.7–4.0)
MCH: 26.6 pg (ref 26.0–34.0)
MCHC: 32.4 g/dL (ref 30.0–36.0)
MCV: 82.1 fL (ref 80.0–100.0)
Monocytes Absolute: 0.6 10*3/uL (ref 0.1–1.0)
Monocytes Relative: 5 %
Neutro Abs: 8.4 10*3/uL — ABNORMAL HIGH (ref 1.7–7.7)
Neutrophils Relative %: 76 %
Platelets: 298 10*3/uL (ref 150–400)
RBC: 5.6 MIL/uL — ABNORMAL HIGH (ref 3.87–5.11)
RDW: 14.5 % (ref 11.5–15.5)
WBC: 11 10*3/uL — ABNORMAL HIGH (ref 4.0–10.5)
nRBC: 0 % (ref 0.0–0.2)

## 2021-01-20 LAB — COMPREHENSIVE METABOLIC PANEL
ALT: 20 U/L (ref 0–44)
AST: 16 U/L (ref 15–41)
Albumin: 4.5 g/dL (ref 3.5–5.0)
Alkaline Phosphatase: 103 U/L (ref 38–126)
Anion gap: 9 (ref 5–15)
BUN: 11 mg/dL (ref 6–20)
CO2: 24 mmol/L (ref 22–32)
Calcium: 9.6 mg/dL (ref 8.9–10.3)
Chloride: 103 mmol/L (ref 98–111)
Creatinine, Ser: 0.55 mg/dL (ref 0.44–1.00)
GFR, Estimated: >60 mL/min (ref >60)
Glucose, Bld: 92 mg/dL (ref 70–99)
Potassium: 3.6 mmol/L (ref 3.5–5.1)
Sodium: 136 mmol/L (ref 135–145)
Total Bilirubin: 0.7 mg/dL (ref 0.3–1.2)
Total Protein: 8.5 g/dL — ABNORMAL HIGH (ref 6.5–8.1)

## 2021-01-20 LAB — MAGNESIUM: Magnesium: 2.1 mg/dL (ref 1.7–2.4)

## 2021-01-20 MED ORDER — HYDROXYZINE HCL 10 MG PO TABS
10.0000 mg | ORAL_TABLET | Freq: Once | ORAL | Status: AC
  Administered 2021-01-20: 21:00:00 10 mg via ORAL
  Filled 2021-01-20: qty 1

## 2021-01-20 NOTE — ED Provider Notes (Signed)
[facility] DEPT Provider Note   CSN: [id number] Arrival date & time: 01/20/21  1814     History No chief complaint on file.   [name] is a 30 y.o. female.  With past medical history of anxiety, cardiomyopathy, heart palpitations who presents to the emergency department with complaint of bilateral hand tingling and dry mouth.  She states that since last night she has had intermittent episodes of her hands tingling and feeling numb.  She also states that she has had a sensation of having a dry mouth.  She states that neither these symptoms have ever happened before.  She denies trauma to her neck or bilateral arms.  She denies decreased strength of her arms.  Regarding her dry mouth she denies chronic use of antihistamine or anticholinergic medications.  She denies taking medications daily.  She denies dry eyes, decreased urinary output or constipation.  Patient has been seen multiple times over the past month.  She was seen on 01/15/2021 after THC ingestion.  She was then seen on 01/18/2021 for anxiety.  At that time she felt like she was having a panic attack and complained of palpitations as well as numbness and tingling to her bilateral hands.  Most recently she was seen yesterday for hypertension and palpitations.  HPI     Past Medical History:  Diagnosis Date   Anxiety    Cardiomyopathy    Depression    Facial angiofibroma    Heart palpitations    Irregular heartbeat    Nosebleed     Patient Active Problem List   Diagnosis Date Noted   Acne rosacea 04/07/2013   Insomnia 04/07/2013   Epistaxis 08/28/2012   Palpitations 06/23/2011   Cardiomyopathy ([place]) 06/09/2010   Preop cardiovascular exam 06/07/2010   Nonspecific abnormal electrocardiogram (ECG) (EKG) 06/07/2010   Chest pain 06/07/2010    Past Surgical History:  Procedure Laterality Date   NASAL HEMORRHAGE CONTROL Bilateral 08/28/2012   Procedure: EPISTAXIS CONTROL;   Surgeon: Jerrell Belfast, MD;  Location: [facility] OR;  Service: ENT;  Laterality: Bilateral;     OB History   No obstetric history on file.     Family History  Problem Relation Age of Onset   Heart disease Father        Unknown type    Social History   Tobacco Use   Smoking status: Never   Smokeless tobacco: Never  Substance Use Topics   Alcohol use: No   Drug use: No    Home Medications Prior to Admission medications   Medication Sig Start Date End Date Taking? Authorizing Provider  citalopram (CELEXA) 20 MG tablet Take 1 tablet (20 mg total) by mouth daily. 12/22/19   Just, Laurita Quint, FNP  hydrOXYzine (ATARAX) 25 MG tablet Take 1 tablet (25 mg total) by mouth every 6 (six) hours. 01/18/21   Suzy Bouchard, PA-C  hydrOXYzine (VISTARIL) 25 MG capsule Take 1 capsule (25 mg total) by mouth 3 (three) times daily as needed for anxiety. 10/30/19   Rozetta Nunnery, NP  Melatonin 3 MG CAPS Take 1 capsule (3 mg total) by mouth at bedtime as needed. Patient not taking: Reported on 05/30/2016 12/03/15   Barrett, Evelene Croon, PA-C  norgestrel-ethinyl estradiol (LO/OVRAL,CRYSELLE) 0.3-30 MG-MCG tablet Take 1 tablet by mouth daily. Patient not taking: Reported on 11/14/2017 06/09/16   Emily Filbert, MD    Allergies    Patient has no known allergies.  Review of Systems   Review of  Systems  Constitutional:  Negative for fever.  HENT:         Dry mouth   Respiratory:  Negative for shortness of breath.   Cardiovascular:  Negative for chest pain and palpitations.  Gastrointestinal:  Negative for abdominal pain.  Genitourinary:  Negative for decreased urine volume and dysuria.  Neurological:  Positive for numbness.  Psychiatric/Behavioral:  The patient is nervous/anxious.   All other systems reviewed and are negative.  Physical Exam Updated Vital Signs BP (!) 129/96 (BP Location: Right Arm)    Pulse 97    Temp 98.7 F (37.1 C) (Oral)    Resp 18    SpO2 95%   Physical Exam Vitals and  nursing note reviewed.  Constitutional:      General: She is not in acute distress.    Appearance: Normal appearance. She is not ill-appearing or toxic-appearing.  HENT:     Head: Normocephalic and atraumatic.     Nose: Nose normal.     Mouth/Throat:     Mouth: Mucous membranes are moist.     Pharynx: Oropharynx is clear. No posterior oropharyngeal erythema.  Eyes:     General: No scleral icterus.    Extraocular Movements: Extraocular movements intact.     Conjunctiva/sclera: Conjunctivae normal.     Pupils: Pupils are equal, round, and reactive to light.  Cardiovascular:     Rate and Rhythm: Normal rate and regular rhythm.     Pulses: Normal pulses.     Heart sounds: No murmur heard. Pulmonary:     Effort: Pulmonary effort is normal. No respiratory distress.     Breath sounds: Normal breath sounds.  Abdominal:     Palpations: Abdomen is soft.  Musculoskeletal:        General: Normal range of motion.     Cervical back: Normal range of motion.  Skin:    General: Skin is warm and dry.     Capillary Refill: Capillary refill takes less than 2 seconds.     Findings: No rash.  Neurological:     General: No focal deficit present.     Mental Status: She is alert and oriented to person, place, and time. Mental status is at baseline.  Psychiatric:        Mood and Affect: Mood normal.        Behavior: Behavior normal.        Thought Content: Thought content normal.        Judgment: Judgment normal.    ED Results / Procedures / Treatments   Labs (all labs ordered are listed, but only abnormal results are displayed) Labs Reviewed  COMPREHENSIVE METABOLIC PANEL - Abnormal; Notable for the following components:      Result Value   Total Protein 8.5 (*)    All other components within normal limits  CBC WITH DIFFERENTIAL/PLATELET - Abnormal; Notable for the following components:   WBC 11.0 (*)    RBC 5.60 (*)    Neutro Abs 8.4 (*)    All other components within normal limits   MAGNESIUM   EKG None  Radiology No results found.  Procedures Procedures   Medications Ordered in ED Medications  hydrOXYzine (ATARAX) tablet 10 mg (has no administration in time range)    ED Course  I have reviewed the triage vital signs and the nursing notes.  Pertinent labs & imaging results that were available during my care of the patient were reviewed by me and considered in my medical decision making (see  chart for details).    MDM Rules/Calculators/A&P 30 year old female who presents to the emergency department for bilateral hand tingling and dry mouth.  Basic labs including magnesium within normal limits.  Was seen yesterday and TSH was normal.  She is not complaining of chest pain or heart palpitations requiring EKG at this time. Pregnancy test 2 days ago negative. Doubt that this is medication toxicity as she does not take medications daily.  Denies continued THC use. No focal neurological deficits or weakness. No neck pain or trauma to account for bilateral arm symptoms.  She does feel somewhat anxious and ruminates in the room on her symptoms.  I have spoken to her at length about the need for her to take her daily prescribed medication including Celexa, hydroxyzine.  She states that she has not filled these medications because she does not have insurance or money.  I asked her if she is attempted to fill them to see what price they are and she states no.  I have discussed with her resources like good Rx and calling the pharmacy to ask how much her medications will be as they are fairly common medications.  She verbalizes that she will try this.  I did discuss with her that a component of her symptoms may be related to her anxiety as the rest of her work-up has been unremarkable.  She endorses understanding.  I have answered all of her questions at this time. I have low suspicion that there are any emergent causes for her symptoms at this time.  No further intervention is  needed at this time.  I have given her a dose of Atarax.  She has follow-up with primary care in February.  I have encouraged her to make this appointment.  She verbalizes understanding.  Instructed to return to the emergency department for new or worsening symptoms.  Safe for discharge.   Final Clinical Impression(s) / ED Diagnoses Final diagnoses:  Dry mouth    Rx / DC Orders ED Discharge Orders     None        Mickie Hillier, PA-C 01/20/21 2050    Fredia Sorrow, MD 02/02/21 1539

## 2021-01-20 NOTE — Discharge Instructions (Addendum)
You were seen in the emergency department today for dry mouth and tingling in your bilateral hands. Your electrolytes appear normal. I believe some of your symptoms may be due to your anxiety. Please contact your pharmacy to discuss how much your medications will cost and use Good[facility] to help with paying for medications. Please return for worsening symptoms or new concerns.

## 2021-01-20 NOTE — ED Notes (Signed)
Patient offered a beverage.

## 2021-01-20 NOTE — ED Triage Notes (Signed)
BIB EMS from home, about 1 hour ago pt started having dry mouth and gums with hands tingling. Pt went to [facility] for the same yesterday. Hx of anxiety.

## 2021-03-28 ENCOUNTER — Ambulatory Visit: Payer: Self-pay | Admitting: Family Medicine

## 2021-04-14 ENCOUNTER — Emergency Department (HOSPITAL_COMMUNITY)
Admission: EM | Admit: 2021-04-14 | Discharge: 2021-04-14 | Disposition: A | Discharge status: Home / Self Care | Payer: BC Managed Care – PPO | Attending: Student | Admitting: Student

## 2021-04-14 ENCOUNTER — Encounter (HOSPITAL_COMMUNITY): Payer: Self-pay

## 2021-04-14 ENCOUNTER — Other Ambulatory Visit: Payer: Self-pay

## 2021-04-14 DIAGNOSIS — R000 Tachycardia, unspecified: Secondary | ICD-10-CM | POA: Insufficient documentation

## 2021-04-14 DIAGNOSIS — E876 Hypokalemia: Secondary | ICD-10-CM | POA: Insufficient documentation

## 2021-04-14 LAB — DIFFERENTIAL
Abs Immature Granulocytes: 0.04 10*3/uL (ref 0.00–0.07)
Basophils Absolute: 0.1 10*3/uL (ref 0.0–0.1)
Basophils Relative: 1 %
Eosinophils Absolute: 0.1 10*3/uL (ref 0.0–0.5)
Eosinophils Relative: 1 %
Immature Granulocytes: 0 %
Lymphocytes Relative: 25 %
Lymphs Abs: 2.7 10*3/uL (ref 0.7–4.0)
Monocytes Absolute: 0.6 10*3/uL (ref 0.1–1.0)
Monocytes Relative: 5 %
Neutro Abs: 7.5 10*3/uL (ref 1.7–7.7)
Neutrophils Relative %: 68 %

## 2021-04-14 LAB — BASIC METABOLIC PANEL
Anion gap: 12 (ref 5–15)
BUN: 15 mg/dL (ref 6–20)
CO2: 23 mmol/L (ref 22–32)
Calcium: 9 mg/dL (ref 8.9–10.3)
Chloride: 100 mmol/L (ref 98–111)
Creatinine, Ser: 0.61 mg/dL (ref 0.44–1.00)
GFR, Estimated: >60 mL/min (ref >60)
Glucose, Bld: 103 mg/dL — ABNORMAL HIGH (ref 70–99)
Potassium: 3 mmol/L — ABNORMAL LOW (ref 3.5–5.1)
Sodium: 135 mmol/L (ref 135–145)

## 2021-04-14 LAB — URINALYSIS, ROUTINE W REFLEX MICROSCOPIC
Bacteria, UA: NONE SEEN
Bilirubin Urine: NEGATIVE
Glucose, UA: NEGATIVE mg/dL
Ketones, ur: 5 mg/dL — AB
Leukocytes,Ua: NEGATIVE
Nitrite: NEGATIVE
Protein, ur: 30 mg/dL — AB
Specific Gravity, Urine: 1.025 (ref 1.005–1.030)
pH: 6 (ref 5.0–8.0)

## 2021-04-14 LAB — CBC
HCT: 47.4 % — ABNORMAL HIGH (ref 36.0–46.0)
Hemoglobin: 15.1 g/dL — ABNORMAL HIGH (ref 12.0–15.0)
MCH: 26.6 pg (ref 26.0–34.0)
MCHC: 31.9 g/dL (ref 30.0–36.0)
MCV: 83.5 fL (ref 80.0–100.0)
Platelets: 273 10*3/uL (ref 150–400)
RBC: 5.68 MIL/uL — ABNORMAL HIGH (ref 3.87–5.11)
RDW: 14.1 % (ref 11.5–15.5)
WBC: 10.9 10*3/uL — ABNORMAL HIGH (ref 4.0–10.5)
nRBC: 0 % (ref 0.0–0.2)

## 2021-04-14 LAB — PREGNANCY, URINE: Preg Test, Ur: NEGATIVE

## 2021-04-14 MED ORDER — POTASSIUM CHLORIDE CRYS ER 20 MEQ PO TBCR: 40.0000 meq | EXTENDED_RELEASE_TABLET | Freq: Two times a day (BID) | ORAL | 0 refills | Status: DC

## 2021-04-14 MED ORDER — HYDROXYZINE HCL 25 MG PO TABS: 25.0000 mg | ORAL_TABLET | Freq: Four times a day (QID) | ORAL | 0 refills | Status: DC

## 2021-04-14 MED ORDER — POTASSIUM CHLORIDE CRYS ER 20 MEQ PO TBCR
40.0000 meq | EXTENDED_RELEASE_TABLET | Freq: Once | ORAL | Status: AC
  Administered 2021-04-14: 40 meq via ORAL
  Filled 2021-04-14: qty 2

## 2021-04-14 NOTE — ED Provider Notes (Signed)
?[place] DEPT ?Provider Note ? ? ?CSN: [id number] ?Arrival date & time: 04/14/21  1925 ? ?  ? ?History ? ?Chief Complaint  ?Patient presents with  ? Dizziness  ? ? ?[name] is a 31 y.o. female. ? ? ?Dizziness ? ?Patient presents feeling "off".  States she has a history of anxiety.  She would like her hands are tingling, feeling her heart is racing but denies any pain anywhere.  She reports she has not been eating and drinking today, denies any SI or HI.  The patient has hydroxyzine, does not have any on her. ? ?Home Medications ?Prior to Admission medications   ?Medication Sig Start Date End Date Taking? Authorizing Provider  ?potassium chloride SA (KLOR-CON M) 20 MEQ tablet Take 2 tablets (40 mEq total) by mouth 2 (two) times daily. 04/14/21  Yes Sherrill Raring, PA-C  ?citalopram (CELEXA) 20 MG tablet Take 1 tablet (20 mg total) by mouth daily. 12/22/19   Just, Laurita Quint, FNP  ?hydrOXYzine (ATARAX) 25 MG tablet Take 1 tablet (25 mg total) by mouth every 6 (six) hours. 04/14/21   Sherrill Raring, PA-C  ?hydrOXYzine (VISTARIL) 25 MG capsule Take 1 capsule (25 mg total) by mouth 3 (three) times daily as needed for anxiety. 10/30/19   Rozetta Nunnery, NP  ?Melatonin 3 MG CAPS Take 1 capsule (3 mg total) by mouth at bedtime as needed. ?Patient not taking: Reported on 05/30/2016 12/03/15   Barrett, Evelene Croon, PA-C  ?norgestrel-ethinyl estradiol (LO/OVRAL,CRYSELLE) 0.3-30 MG-MCG tablet Take 1 tablet by mouth daily. ?Patient not taking: Reported on 11/14/2017 06/09/16   Emily Filbert, MD  ?   ? ?Allergies    ?Patient has no known allergies.   ? ?Review of Systems   ?Review of Systems  ?Neurological:  Positive for dizziness.  ? ?Physical Exam ?Updated Vital Signs ?BP (!) 145/104 (BP Location: Right Arm)   Pulse (!) 110   Temp 98.2 ?F (36.8 ?C) (Oral)   Resp 16   Ht '5\' 2"'$  (1.575 m)   Wt 93.4 kg   SpO2 100%   BMI 37.68 kg/m?  ?Physical Exam ?Vitals and nursing note reviewed. Exam conducted with  a chaperone present.  ?Constitutional:   ?   Appearance: Normal appearance.  ?HENT:  ?   Head: Normocephalic and atraumatic.  ?Eyes:  ?   General: No scleral icterus.    ?   Right eye: No discharge.     ?   Left eye: No discharge.  ?   Extraocular Movements: Extraocular movements intact.  ?   Pupils: Pupils are equal, round, and reactive to light.  ?Cardiovascular:  ?   Rate and Rhythm: Regular rhythm. Tachycardia present.  ?   Pulses: Normal pulses.  ?   Heart sounds: Normal heart sounds. No murmur heard. ?  No friction rub. No gallop.  ?Pulmonary:  ?   Effort: Pulmonary effort is normal. No respiratory distress.  ?   Breath sounds: Normal breath sounds.  ?Abdominal:  ?   General: Abdomen is flat. Bowel sounds are normal. There is no distension.  ?   Palpations: Abdomen is soft.  ?   Tenderness: There is no abdominal tenderness.  ?Skin: ?   General: Skin is warm and dry.  ?   Coloration: Skin is not jaundiced.  ?Neurological:  ?   Mental Status: She is alert. Mental status is at baseline.  ?   Coordination: Coordination normal.  ? ? ?ED Results / Procedures /  Treatments   ?Labs ?(all labs ordered are listed, but only abnormal results are displayed) ?Labs Reviewed  ?BASIC METABOLIC PANEL - Abnormal; Notable for the following components:  ?    Result Value  ? Potassium 3.0 (*)   ? Glucose, Bld 103 (*)   ? All other components within normal limits  ?CBC - Abnormal; Notable for the following components:  ? WBC 10.9 (*)   ? RBC 5.68 (*)   ? Hemoglobin 15.1 (*)   ? HCT 47.4 (*)   ? All other components within normal limits  ?URINALYSIS, ROUTINE W REFLEX MICROSCOPIC - Abnormal; Notable for the following components:  ? Hgb urine dipstick MODERATE (*)   ? Ketones, ur 5 (*)   ? Protein, ur 30 (*)   ? All other components within normal limits  ?PREGNANCY, URINE  ?DIFFERENTIAL  ? ? ?EKG ?None ? ?Radiology ?No results found. ? ?Procedures ?Procedures  ? ? ?Medications Ordered in ED ?Medications  ?potassium chloride SA (KLOR-CON  M) CR tablet 40 mEq (has no administration in time range)  ? ? ?ED Course/ Medical Decision Making/ A&P ?  ?                        ?Medical Decision Making ?Amount and/or Complexity of Data Reviewed ?Labs: ordered. ? ?Risk ?Prescription drug management. ? ? ?This patient presents to the ED for concern of dizziness/feeling off, this involves an extensive number of treatment options, and is a complaint that carries with it a high risk of complications and morbidity.  The differential diagnosis includes arrhythmia, dehydration, hypoglycemia, electrolyte derangement, COVID, other ? ?Patient?s presentation is complicated by their history of palpitations, she has been seen by cardiology for this in the past per chart review. ? ? ?Additional history obtained:  ? ?Reviewed cardiology note from prior, has not been seen recently. ? ?  ?Lab Tests: ? ?I ordered, viewed, and personally interpreted labs.  The pertinent results include:   ?Slight hypokalemia at 3.0.  K-Dur ordered.  Mild leukocytosis at 10.9, likely reactionary.  There is no gross electrolyte derangement or AKI noted.  Urine without signs of UTI. ?  ?ECG/Cardiac monitoring:  ? ?Per my interpretation, EKG shows sinus tachycardia but no underlying arrhythmia noted, T waves roughly unchanged from prior ? ?The patient was maintained on a cardiac monitor.  Visualized monitor strip which showed sinus rhythm ranging from 90-98 Per my interpretation.  ? ? ?Medicines ordered and prescription drug management: ? ?I ordered medication including: kDUR ? ?I have reviewed the patients home medicines and have made adjustments as needed ? ?Reevaluation: ? ?After the interventions noted above, I reevaluated the patient and found patient feels improved after being given food ? ? ?Problems addressed / ED Course: ?31 year old female presenting due to "feeling off".  She is slightly tachycardic on exam, on the monitor she is in sinus arrhythmia going from a heart rate in the 90s to  roughly 108103.  Work-up is as documented above, she did have slight hypokalemia.  She has no acute complaints, simply no chest pain, shortness of breath, generalized weakness, loss of consciousness.  She has not eaten today which I think may be contributing to her dizziness.  Patient given sandwich, reports feeling improved. ? ?Social Determinants of Health: ? ?  ?Disposition: ? ? ?After consideration of the diagnostic results and the patients response to treatment, I feel that the patent would benefit from outpatient follow-up.  I do not see  any reasons for patient to be admitted at this time.. ? ?  ? ? ? ? ? ? ? ? ?Final Clinical Impression(s) / ED Diagnoses ?Final diagnoses:  ?Dizziness  ?Hypokalemia  ? ? ?Rx / DC Orders ?ED Discharge Orders   ? ?      Ordered  ?  potassium chloride SA (KLOR-CON M) 20 MEQ tablet  2 times daily       ? 04/14/21 2049  ?  hydrOXYzine (ATARAX) 25 MG tablet  Every 6 hours       ? 04/14/21 2049  ? ?  ?  ? ?  ? ? ?  ?Sherrill Raring, PA-C ?04/14/21 2054 ? ?  ?Teressa Lower, MD ?04/14/21 2357 ? ?

## 2021-04-14 NOTE — Discharge Instructions (Addendum)
Your potassium today was slightly low, take the K-Dur for the next week and recheck with your primary care in the next few weeks.  Drink plenty of fluids, try and eat more food.  Your work-up today looked good, hydroxyzine refill sent in.  Return if things change or worsen. ?

## 2021-04-14 NOTE — ED Triage Notes (Signed)
Patient BIB EMS from work, states she feels "off" today. Has not eaten since this morning, feels dizzy. States she takes hydroxyzine for anxiety but did not have it with her today.  ? ?EMS vitals: ?HR 122 ?BP 176/96 ?SPO2 96% RA ?

## 2021-05-09 ENCOUNTER — Emergency Department (HOSPITAL_COMMUNITY)
Admission: EM | Admit: 2021-05-09 | Discharge: 2021-05-09 | Disposition: A | Discharge status: Home / Self Care | Payer: Self-pay | Attending: Emergency Medicine | Admitting: Emergency Medicine

## 2021-05-09 ENCOUNTER — Encounter (HOSPITAL_COMMUNITY): Payer: Self-pay

## 2021-05-09 DIAGNOSIS — R319 Hematuria, unspecified: Secondary | ICD-10-CM | POA: Diagnosis not present

## 2021-05-09 DIAGNOSIS — Z79899 Other long term (current) drug therapy: Secondary | ICD-10-CM | POA: Insufficient documentation

## 2021-05-09 DIAGNOSIS — I10 Essential (primary) hypertension: Secondary | ICD-10-CM | POA: Diagnosis not present

## 2021-05-09 DIAGNOSIS — R519 Headache, unspecified: Secondary | ICD-10-CM | POA: Diagnosis present

## 2021-05-09 LAB — CBC WITH DIFFERENTIAL/PLATELET
Abs Immature Granulocytes: 0.03 10*3/uL (ref 0.00–0.07)
Basophils Absolute: 0 10*3/uL (ref 0.0–0.1)
Basophils Relative: 0 %
Eosinophils Absolute: 0.1 10*3/uL (ref 0.0–0.5)
Eosinophils Relative: 1 %
HCT: 50.7 % — ABNORMAL HIGH (ref 36.0–46.0)
Hemoglobin: 16.2 g/dL — ABNORMAL HIGH (ref 12.0–15.0)
Immature Granulocytes: 0 %
Lymphocytes Relative: 26 %
Lymphs Abs: 2.2 10*3/uL (ref 0.7–4.0)
MCH: 26.7 pg (ref 26.0–34.0)
MCHC: 32 g/dL (ref 30.0–36.0)
MCV: 83.5 fL (ref 80.0–100.0)
Monocytes Absolute: 0.5 10*3/uL (ref 0.1–1.0)
Monocytes Relative: 6 %
Neutro Abs: 5.7 10*3/uL (ref 1.7–7.7)
Neutrophils Relative %: 67 %
Platelets: 312 10*3/uL (ref 150–400)
RBC: 6.07 MIL/uL — ABNORMAL HIGH (ref 3.87–5.11)
RDW: 13.9 % (ref 11.5–15.5)
WBC: 8.6 10*3/uL (ref 4.0–10.5)
nRBC: 0 % (ref 0.0–0.2)

## 2021-05-09 LAB — COMPREHENSIVE METABOLIC PANEL
ALT: 31 U/L (ref 0–44)
AST: 21 U/L (ref 15–41)
Albumin: 4.6 g/dL (ref 3.5–5.0)
Alkaline Phosphatase: 112 U/L (ref 38–126)
Anion gap: 9 (ref 5–15)
BUN: 12 mg/dL (ref 6–20)
CO2: 27 mmol/L (ref 22–32)
Calcium: 9.9 mg/dL (ref 8.9–10.3)
Chloride: 100 mmol/L (ref 98–111)
Creatinine, Ser: 0.61 mg/dL (ref 0.44–1.00)
GFR, Estimated: >60 mL/min (ref >60)
Glucose, Bld: 94 mg/dL (ref 70–99)
Potassium: 3.8 mmol/L (ref 3.5–5.1)
Sodium: 136 mmol/L (ref 135–145)
Total Bilirubin: 0.7 mg/dL (ref 0.3–1.2)
Total Protein: 8.9 g/dL — ABNORMAL HIGH (ref 6.5–8.1)

## 2021-05-09 LAB — URINALYSIS, ROUTINE W REFLEX MICROSCOPIC
Bilirubin Urine: NEGATIVE
Glucose, UA: NEGATIVE mg/dL
Ketones, ur: NEGATIVE mg/dL
Leukocytes,Ua: NEGATIVE
Nitrite: NEGATIVE
Protein, ur: NEGATIVE mg/dL
Specific Gravity, Urine: 1.011 (ref 1.005–1.030)
pH: 7 (ref 5.0–8.0)

## 2021-05-09 LAB — PREGNANCY, URINE: Preg Test, Ur: NEGATIVE

## 2021-05-09 MED ORDER — HYDROCHLOROTHIAZIDE 12.5 MG PO TABS: 12.5000 mg | ORAL_TABLET | Freq: Every day | ORAL | 2 refills | Status: DC

## 2021-05-09 NOTE — ED Triage Notes (Signed)
Pt presents with c/o hypertension. Pt reports that she has been under some stress, quit her job today, and has been concerned because her blood pressure has been elevated.  ?

## 2021-05-09 NOTE — ED Provider Notes (Signed)
?[place] DEPT ?Provider Note ? ? ?CSN: [id number] ?Arrival date & time: 05/09/21  7915 ? ?  ? ?History ? ?Chief Complaint  ?Patient presents with  ? Hypertension  ? ? ?[name] is a 31 y.o. female. ? ?HPI ?Patient presents for evaluation of high blood pressure.  She checked it at work yesterday and it was 140/100.  She complains of a mild headache.  She has occasional dizziness when climbing stairs.  She denies shortness of breath, cough, focal weakness or paresthesia.  She feels like she has been under stress, because she recently quit her job.  She states that previously she has been on metoprolol, for high blood pressure but stopped because her blood pressure became normal.  She denies current menses.  She occasionally has blood in her urine that she notices when she wipes.  She denies dysuria or urinary frequency. ?  ? ?Home Medications ?Prior to Admission medications   ?Medication Sig Start Date End Date Taking? Authorizing Provider  ?hydrochlorothiazide (HYDRODIURIL) 12.5 MG tablet Take 1 tablet (12.5 mg total) by mouth daily. 05/09/21  Yes Daleen Bo, MD  ?citalopram (CELEXA) 20 MG tablet Take 1 tablet (20 mg total) by mouth daily. 12/22/19   Just, Laurita Quint, FNP  ?hydrOXYzine (ATARAX) 25 MG tablet Take 1 tablet (25 mg total) by mouth every 6 (six) hours. 04/14/21   Sherrill Raring, PA-C  ?hydrOXYzine (VISTARIL) 25 MG capsule Take 1 capsule (25 mg total) by mouth 3 (three) times daily as needed for anxiety. 10/30/19   Rozetta Nunnery, NP  ?Melatonin 3 MG CAPS Take 1 capsule (3 mg total) by mouth at bedtime as needed. ?Patient not taking: Reported on 05/30/2016 12/03/15   Barrett, Evelene Croon, PA-C  ?norgestrel-ethinyl estradiol (LO/OVRAL,CRYSELLE) 0.3-30 MG-MCG tablet Take 1 tablet by mouth daily. ?Patient not taking: Reported on 11/14/2017 06/09/16   Emily Filbert, MD  ?potassium chloride SA (KLOR-CON M) 20 MEQ tablet Take 2 tablets (40 mEq total) by mouth 2 (two) times daily.  04/14/21   Sherrill Raring, PA-C  ?   ? ?Allergies    ?Patient has no known allergies.   ? ?Review of Systems   ?Review of Systems ? ?Physical Exam ?Updated Vital Signs ?BP (!) 143/102   Pulse 100   Temp 98.2 ?F (36.8 ?C) (Oral)   Resp 13   Ht 5' (1.524 m)   Wt 104.3 kg   SpO2 100%   BMI 44.92 kg/m?  ?Physical Exam ?Vitals and nursing note reviewed.  ?Constitutional:   ?   General: She is not in acute distress. ?   Appearance: She is well-developed. She is not ill-appearing, toxic-appearing or diaphoretic.  ?HENT:  ?   Head: Normocephalic and atraumatic.  ?   Right Ear: External ear normal.  ?   Left Ear: External ear normal.  ?Eyes:  ?   Conjunctiva/sclera: Conjunctivae normal.  ?   Pupils: Pupils are equal, round, and reactive to light.  ?Neck:  ?   Trachea: Phonation normal.  ?Cardiovascular:  ?   Rate and Rhythm: Normal rate and regular rhythm.  ?   Heart sounds: Normal heart sounds.  ?Pulmonary:  ?   Effort: Pulmonary effort is normal.  ?   Breath sounds: Normal breath sounds.  ?Abdominal:  ?   Palpations: Abdomen is soft.  ?   Tenderness: There is no abdominal tenderness.  ?Musculoskeletal:     ?   General: Normal range of motion.  ?   Cervical  back: Normal range of motion and neck supple.  ?Skin: ?   General: Skin is warm and dry.  ?Neurological:  ?   Mental Status: She is alert and oriented to person, place, and time.  ?   Cranial Nerves: No cranial nerve deficit.  ?   Sensory: No sensory deficit.  ?   Motor: No abnormal muscle tone.  ?   Coordination: Coordination normal.  ?   Comments: No dysarthria or aphasia  ?Psychiatric:     ?   Mood and Affect: Mood normal.     ?   Behavior: Behavior normal.     ?   Thought Content: Thought content normal.     ?   Judgment: Judgment normal.  ? ? ?ED Results / Procedures / Treatments   ?Labs ?(all labs ordered are listed, but only abnormal results are displayed) ?Labs Reviewed  ?CBC WITH DIFFERENTIAL/PLATELET - Abnormal; Notable for the following components:  ?     Result Value  ? RBC 6.07 (*)   ? Hemoglobin 16.2 (*)   ? HCT 50.7 (*)   ? All other components within normal limits  ?COMPREHENSIVE METABOLIC PANEL - Abnormal; Notable for the following components:  ? Total Protein 8.9 (*)   ? All other components within normal limits  ?URINALYSIS, ROUTINE W REFLEX MICROSCOPIC - Abnormal; Notable for the following components:  ? Hgb urine dipstick MODERATE (*)   ? Bacteria, UA RARE (*)   ? All other components within normal limits  ?PREGNANCY, URINE  ? ? ?EKG ?EKG Interpretation ? ?Date/Time:  Monday May 09 2021 12:46:39 EDT ?Ventricular Rate:  94 ?PR Interval:  148 ?QRS Duration: 79 ?QT Interval:  345 ?QTC Calculation: 432 ?R Axis:   46 ?Text Interpretation: Sinus rhythm Nonspecific T abnormalities, diffuse leads Since last tracing rate slower Otherwise no significant change Confirmed by Daleen Bo [phone]) on 05/09/2021 1:46:28 PM ? ?Radiology ?No results found. ? ?Procedures ?Procedures  ? ? ?Medications Ordered in ED ?Medications - No data to display ? ?ED Course/ Medical Decision Making/ A&P ?  ?                        ?Medical Decision Making ?Patient presenting for evaluation of measured high blood pressure at work yesterday.  She does not have any significant neurologic symptoms that are persistent or worsen.  She does have hematuria by history and has previously had hematuria. ? ?Problems Addressed: ?Hematuria, unspecified type: chronic illness or injury ?Hypertension, unspecified type: undiagnosed new problem with uncertain prognosis ? ?Amount and/or Complexity of Data Reviewed ?External Data Reviewed: notes. ?   Details: Review of prior blood pressures, on multiple prior evaluations, typically 140/100 range.  She has also had multiple prior urinalyses, showing hematuria. ?Labs: ordered. ?   Details: CBC, metabolic panel analysis-normal except blood in urine.  Patient was verbally informed about blood in the urine ? ?Risk ?Prescription drug management. ?Risk Details:  Patient presenting with high blood pressure which she checked at home.  Nontoxic.  Doubt hypertensive crisis.  Mild hematuria, chronic per review of old chart.  Patient referred to PCP for follow-up and management.  Prescription given for hydrochlorothiazide to treat blood pressure and advised to use low-salt diet, and get some mild exercise.  Doubt the metabolic instability or hemodynamic status. ? ? ? ? ? ? ? ? ? ? ?Final Clinical Impression(s) / ED Diagnoses ?Final diagnoses:  ?Hypertension, unspecified type  ?Hematuria, unspecified type  ? ? ?  Rx / DC Orders ?ED Discharge Orders   ? ?      Ordered  ?  hydrochlorothiazide (HYDRODIURIL) 12.5 MG tablet  Daily       ? 05/09/21 1512  ? ?  ?  ? ?  ? ? ?  ?Daleen Bo, MD ?05/09/21 1746 ? ?

## 2021-05-09 NOTE — Discharge Instructions (Addendum)
We are treating you with a medicine for blood pressure which was sent to your pharmacy.  Stay on a low-salt diet and try to keep your stress low.  Call your regular doctor or use the resource guide to help you find a primary care doctor to see for further care and treatment in the next couple of weeks.  Return here, if needed. ?

## 2021-05-09 NOTE — ED Provider Triage Note (Signed)
Emergency Medicine Provider Triage Evaluation Note ? ?[name] , a 31 y.o. female  was evaluated in triage.  Pt complains of a blood pressure for the past few days.  She also reports some lightheadedness, no room spinning sensation.  Denies any chest pain or shortness of breath.  She reports has been under a lot of stress recently recently quit her job as well and is concerned for her elevated blood pressure.  ?Review of Systems  ?Positive: See above ?Negative: See above ? ?Physical Exam  ?BP (!) 137/99 (BP Location: Left Arm)   Pulse (!) 122   Temp 98.2 ?F (36.8 ?C) (Oral)   Resp 18   Ht 5' (1.524 m)   Wt 104.3 kg   SpO2 100%   BMI 44.92 kg/m?  ?Gen:   Awake, no distress   ?Resp:  Normal effort  ?MSK:   Moves extremities without difficulty  ?Other:  BP 137/99. Normal speech, answering questions appropriately.  ? ?Medical Decision Making  ?Medically screening exam initiated at 12:26 PM.  Appropriate orders placed.  [name] was informed that the remainder of the evaluation will be completed by another provider, this initial triage assessment does not replace that evaluation, and the importance of remaining in the ED until their evaluation is complete. ? ?NAD. Basic labs and EKG ordered.  ?  ?Sherrell Puller, PA-C ?05/09/21 1228 ? ?

## 2021-05-16 ENCOUNTER — Encounter: Payer: Self-pay | Admitting: Family Medicine

## 2021-05-16 ENCOUNTER — Ambulatory Visit (INDEPENDENT_AMBULATORY_CARE_PROVIDER_SITE_OTHER): Payer: Self-pay | Admitting: Family Medicine

## 2021-05-16 VITALS — BP 118/85 | HR 85 | Ht 60.0 in | Wt 198.4 lb

## 2021-05-16 DIAGNOSIS — I10 Essential (primary) hypertension: Secondary | ICD-10-CM | POA: Insufficient documentation

## 2021-05-16 DIAGNOSIS — F32A Depression, unspecified: Secondary | ICD-10-CM

## 2021-05-16 DIAGNOSIS — F419 Anxiety disorder, unspecified: Secondary | ICD-10-CM | POA: Insufficient documentation

## 2021-05-16 HISTORY — DX: Essential (primary) hypertension: I10

## 2021-05-16 MED ORDER — CITALOPRAM HYDROBROMIDE 20 MG PO TABS: 20.0000 mg | ORAL_TABLET | Freq: Every day | ORAL | 3 refills | Status: DC

## 2021-05-16 MED ORDER — HYDROCHLOROTHIAZIDE 12.5 MG PO TABS: 12.5000 mg | ORAL_TABLET | Freq: Every day | ORAL | 2 refills | Status: DC

## 2021-05-16 NOTE — Assessment & Plan Note (Signed)
Blood pressure is at goal for age and co-morbidities.  I recommend continue HCTZ 12.5 mg daily .   ?- BP goal <130/80 ?- monitor and log blood pressures at home ?- check around the same time each day in a relaxed setting ?- Limit salt to <2000 mg/day ?- Follow DASH eating plan (heart healthy diet) ?- limit alcohol to 2 standard drinks per day for men and 1 per day for women ?- avoid tobacco products ?- get at least 2 hours of regular aerobic exercise weekly ?Patient aware of signs/symptoms requiring further/urgent evaluation. ?Follow-up in 2-4 weeks to repeat labs and reassess BP.  ?

## 2021-05-16 NOTE — Patient Instructions (Addendum)
Check your medications through GoodRx for cheaper options.  ? ? ?Blood pressure is at goal for age and co-morbidities.  I recommend continue HCTZ 12.5 mg daily .   ?- BP goal <130/80 ?- monitor and log blood pressures at home ?- check around the same time each day in a relaxed setting ?- Limit salt to <2000 mg/day ?- Follow DASH eating plan (heart healthy diet) ?- limit alcohol to 2 standard drinks per day for men and 1 per day for women ?- avoid tobacco products ?- get at least 2 hours of regular aerobic exercise weekly ?Patient aware of signs/symptoms requiring further/urgent evaluation. ?Follow-up in 2-4 weeks to repeat labs and reassess BP.  ? ? ? ?

## 2021-05-16 NOTE — Assessment & Plan Note (Signed)
Stable. No SI/HI. Giving printed prescription for Celexa for her to compare GoodRx rates. Patient aware of signs/symptoms requiring further/urgent evaluation.  ?

## 2021-05-16 NOTE — Progress Notes (Signed)
? ?______________________________________________________________________ ? ?HPI ?[name] is a 31 y.o. female presenting to [place] at [facility] today to establish care.  ? ?Patient Care Team: ?Terrilyn Saver, NP as PCP - General (Family Medicine) ? ?Health Maintenance  ?Topic Date Due  ? COVID-19 Vaccine (1) Never done  ? HIV Screening  Never done  ? Hepatitis C Screening: USPSTF Recommendation to screen - Ages 27-79 yo.  Never done  ? Pap Smear  Never done  ? Flu Shot  08/30/2021  ? Tetanus Vaccine  11/20/2030  ? HPV Vaccine  Aged Out  ? ? ? ?Concerns today: ? ?05/09/21 - She went to the ED for HTN "scare" - she had readings of 140s/110s at work and got concerned, lightheaded at the time. They started her on HCTZ 12.5 mg daily.  ? ?Patient reports she is doing much better now.  She is working hard to lose weight and adjust her lifestyle to improve her blood pressure.  States she would like more information on heart healthy diets.  She denies any chest pain, dyspnea, palpitations, recurrent headaches, vision changes, edema. ? ?Patient also reports history of anxiety.  States she has been on Celexa in the past but has not been taking it due to expenses.  States she has been doing the as needed hydroxyzine only about 3 times per week.  States her moods are stable.  No SI/HI.  She would like a new prescription for the Celexa to try to look at good Rx rates for most affordable pharmacy. ? ? ? ?Patient Active Problem List  ? Diagnosis Date Noted  ? Primary hypertension 05/16/2021  ? Anxiety and depression 05/16/2021  ? Acne rosacea 04/07/2013  ? Insomnia 04/07/2013  ? Epistaxis 08/28/2012  ? Palpitations 06/23/2011  ? Cardiomyopathy ([place]) 06/09/2010  ? Preop cardiovascular exam 06/07/2010  ? Nonspecific abnormal electrocardiogram (ECG) (EKG) 06/07/2010  ? Chest pain 06/07/2010  ? ? ? ? ?______________________________________________________________________ ?PMH ?Past Medical History:   ?Diagnosis Date  ? Anxiety   ? Cardiomyopathy   ? Depression   ? Facial angiofibroma   ? Heart palpitations   ? Irregular heartbeat   ? Nosebleed   ? ? ?ROS ?All review of systems negative except what is listed in the HPI ? ?PHYSICAL EXAM ?Physical Exam ?Vitals reviewed.  ?Constitutional:   ?   Appearance: Normal appearance.  ?Cardiovascular:  ?   Rate and Rhythm: Normal rate and regular rhythm.  ?   Pulses: Normal pulses.  ?   Heart sounds: Normal heart sounds.  ?Pulmonary:  ?   Effort: Pulmonary effort is normal.  ?   Breath sounds: Normal breath sounds.  ?Musculoskeletal:  ?   Right lower leg: No edema.  ?   Left lower leg: No edema.  ?Skin: ?   General: Skin is warm and dry.  ?Neurological:  ?   General: No focal deficit present.  ?   Mental Status: She is alert and oriented to person, place, and time. Mental status is at baseline.  ?Psychiatric:     ?   Mood and Affect: Mood normal.     ?   Behavior: Behavior normal.     ?   Thought Content: Thought content normal.     ?   Judgment: Judgment normal.  ? ?______________________________________________________________________ ?ASSESSMENT AND PLAN ?Problem List Items Addressed This Visit   ? ?  ? Cardiovascular and Mediastinum  ? Primary hypertension - Primary  ?  Blood pressure is  at goal for age and co-morbidities.  I recommend continue HCTZ 12.5 mg daily .   ?- BP goal <130/80 ?- monitor and log blood pressures at home ?- check around the same time each day in a relaxed setting ?- Limit salt to <2000 mg/day ?- Follow DASH eating plan (heart healthy diet) ?- limit alcohol to 2 standard drinks per day for men and 1 per day for women ?- avoid tobacco products ?- get at least 2 hours of regular aerobic exercise weekly ?Patient aware of signs/symptoms requiring further/urgent evaluation. ?Follow-up in 2-4 weeks to repeat labs and reassess BP.  ?  ?  ? Relevant Medications  ? hydrochlorothiazide (HYDRODIURIL) 12.5 MG tablet  ?  ? Other  ? Anxiety and depression  ?   Stable. No SI/HI. Giving printed prescription for Celexa for her to compare GoodRx rates. Patient aware of signs/symptoms requiring further/urgent evaluation.  ? ?  ?  ? Relevant Medications  ? citalopram (CELEXA) 20 MG tablet  ? ?Other Visit Diagnoses   ? ? Depression, unspecified depression type      ? Relevant Medications  ? citalopram (CELEXA) 20 MG tablet  ? ?  ? ? ? ?*unable to void today to recheck urine (hematuria at ED) recheck at next visit ? ? ?Establish care  ?Education provided today during visit and on AVS for patient to review at home.  ?Diet and Exercise recommendations provided.  ?Current diagnoses and recommendations discussed. ?HM recommendations reviewed with recommendations.  ? ? ?Outpatient Encounter Medications as of 05/16/2021  ?Medication Sig  ? hydrOXYzine (ATARAX) 25 MG tablet Take 1 tablet (25 mg total) by mouth every 6 (six) hours.  ? hydrOXYzine (VISTARIL) 25 MG capsule Take 1 capsule (25 mg total) by mouth 3 (three) times daily as needed for anxiety.  ? Melatonin 3 MG CAPS Take 1 capsule (3 mg total) by mouth at bedtime as needed.  ? potassium chloride SA (KLOR-CON M) 20 MEQ tablet Take 2 tablets (40 mEq total) by mouth 2 (two) times daily.  ? [DISCONTINUED] citalopram (CELEXA) 20 MG tablet Take 1 tablet (20 mg total) by mouth daily.  ? [DISCONTINUED] hydrochlorothiazide (HYDRODIURIL) 12.5 MG tablet Take 1 tablet (12.5 mg total) by mouth daily.  ? [DISCONTINUED] norgestrel-ethinyl estradiol (LO/OVRAL,CRYSELLE) 0.3-30 MG-MCG tablet Take 1 tablet by mouth daily.  ? citalopram (CELEXA) 20 MG tablet Take 1 tablet (20 mg total) by mouth daily.  ? hydrochlorothiazide (HYDRODIURIL) 12.5 MG tablet Take 1 tablet (12.5 mg total) by mouth daily.  ? ?No facility-administered encounter medications on file as of 05/16/2021.  ? ? ?Return in about 2 weeks (around 05/30/2021) for 2-4 weeks BP, lab f/u . ? ? ? ?Purcell Nails Olevia Bowens, DNP, FNP-C ? ? ?

## 2021-06-03 ENCOUNTER — Ambulatory Visit (INDEPENDENT_AMBULATORY_CARE_PROVIDER_SITE_OTHER): Payer: Self-pay | Admitting: Family Medicine

## 2021-06-03 ENCOUNTER — Encounter: Payer: Self-pay | Admitting: Family Medicine

## 2021-06-03 VITALS — BP 113/79 | HR 87 | Ht 60.0 in | Wt 194.6 lb

## 2021-06-03 DIAGNOSIS — F32A Depression, unspecified: Secondary | ICD-10-CM

## 2021-06-03 DIAGNOSIS — I10 Essential (primary) hypertension: Secondary | ICD-10-CM

## 2021-06-03 DIAGNOSIS — F419 Anxiety disorder, unspecified: Secondary | ICD-10-CM

## 2021-06-03 DIAGNOSIS — R319 Hematuria, unspecified: Secondary | ICD-10-CM

## 2021-06-03 LAB — URINALYSIS, ROUTINE W REFLEX MICROSCOPIC
Ketones, ur: NEGATIVE
Leukocytes,Ua: NEGATIVE
Nitrite: NEGATIVE
Specific Gravity, Urine: 1.02 (ref 1.000–1.030)
Urine Glucose: NEGATIVE
Urobilinogen, UA: 0.2 (ref 0.0–1.0)
WBC, UA: NONE SEEN (ref 0–?)
pH: 6 (ref 5.0–8.0)

## 2021-06-03 LAB — CBC WITH DIFFERENTIAL/PLATELET
Basophils Absolute: 0 10*3/uL (ref 0.0–0.1)
Basophils Relative: 0.6 % (ref 0.0–3.0)
Eosinophils Absolute: 0.1 10*3/uL (ref 0.0–0.7)
Eosinophils Relative: 1.5 % (ref 0.0–5.0)
HCT: 44.2 % (ref 36.0–46.0)
Hemoglobin: 14.7 g/dL (ref 12.0–15.0)
Lymphocytes Relative: 24.9 % (ref 12.0–46.0)
Lymphs Abs: 2 10*3/uL (ref 0.7–4.0)
MCHC: 33.3 g/dL (ref 30.0–36.0)
MCV: 80.9 fl (ref 78.0–100.0)
Monocytes Absolute: 0.6 10*3/uL (ref 0.1–1.0)
Monocytes Relative: 7.2 % (ref 3.0–12.0)
Neutro Abs: 5.2 10*3/uL (ref 1.4–7.7)
Neutrophils Relative %: 65.8 % (ref 43.0–77.0)
Platelets: 266 10*3/uL (ref 150.0–400.0)
RBC: 5.46 Mil/uL — ABNORMAL HIGH (ref 3.87–5.11)
RDW: 14.2 % (ref 11.5–15.5)
WBC: 7.9 10*3/uL (ref 4.0–10.5)

## 2021-06-03 LAB — COMPREHENSIVE METABOLIC PANEL
ALT: 30 U/L (ref 0–35)
AST: 20 U/L (ref 0–37)
Albumin: 4.7 g/dL (ref 3.5–5.2)
Alkaline Phosphatase: 105 U/L (ref 39–117)
BUN: 12 mg/dL (ref 6–23)
CO2: 34 mEq/L — ABNORMAL HIGH (ref 19–32)
Calcium: 10.1 mg/dL (ref 8.4–10.5)
Chloride: 96 mEq/L (ref 96–112)
Creatinine, Ser: 0.71 mg/dL (ref 0.40–1.20)
GFR: 112.82 mL/min (ref >60.00)
Glucose, Bld: 90 mg/dL (ref 70–99)
Potassium: 3.9 mEq/L (ref 3.5–5.1)
Sodium: 139 mEq/L (ref 135–145)
Total Bilirubin: 0.6 mg/dL (ref 0.2–1.2)
Total Protein: 7.8 g/dL (ref 6.0–8.3)

## 2021-06-03 NOTE — Patient Instructions (Signed)
Blood pressure is at goal for age and co-morbidities.  I recommend continuing HCTZ 12.5 mg daily. Start checking your blood pressure in the morning before taking your medication; if < 110 on the top number, wait a few hours and recheck before taking your medication. I don't want you to start dropping too low and feeling bad. If you are borderline and still hesitant, you could always start by taking a half tablet and rechecking BP in a few hours.   ?- BP goal <130/80 ?- monitor and log blood pressures at home ?- check around the same time each day in a relaxed setting ?- Limit salt to <2000 mg/day ?- Follow DASH eating plan (heart healthy diet) ?- limit alcohol to 2 standard drinks per day for men and 1 per day for women ?- avoid tobacco products ?- get at least 2 hours of regular aerobic exercise weekly ?Patient aware of signs/symptoms requiring further/urgent evaluation. ?Labs updated today. ? ?

## 2021-06-03 NOTE — Assessment & Plan Note (Signed)
Blood pressure is at goal for age and co-morbidities.  I recommend continuing HCTZ 12.5 mg daily. Start checking your blood pressure in the morning before taking your medication; if < 110 on the top number, wait a few hours and recheck before taking your medication. I don't want you to start dropping too low and feeling bad. If you are borderline and still hesitant, you could always start by taking a half tablet and rechecking BP in a few hours.   ?- BP goal <130/80 ?- monitor and log blood pressures at home ?- check around the same time each day in a relaxed setting ?- Limit salt to <2000 mg/day ?- Follow DASH eating plan (heart healthy diet) ?- limit alcohol to 2 standard drinks per day for men and 1 per day for women ?- avoid tobacco products ?- get at least 2 hours of regular aerobic exercise weekly ?Patient aware of signs/symptoms requiring further/urgent evaluation. ?Labs updated today. ?

## 2021-06-03 NOTE — Assessment & Plan Note (Signed)
Doing really good and would like to try coming off of Celexa as she doesn't think she needs it. Recommend she cut tablet in half for the next 1-2 weeks then can try to stop. No SI/HI. ?

## 2021-06-03 NOTE — Progress Notes (Signed)
? ?Established Patient Office Visit ? ?Subjective   ?Patient ID: [name], female    DOB: 15-Aug-1990  Age: 32 y.o. MRN: [id number] ? ?CC: HTN f/u  ? ? ?HPI ? ?05/16/21: Patient established care and wanted to discuss HTN after recent ED visit. She had been feeling much better after starting HCTZ 12.5 mg daily. At last visit BP was good and she was encouraged to f/u in 2-4 weeks to ensure BP was stable and recheck CBC/CMP, urine. UA in the ED with + Hgb; CBC with elevated H&H/RBC. ? ?Today she reports she is doing really well. She has been working hard on medication compliance and lifestyle modifications. She would like to start seeing a nutritionist to get better with diet options.Mood is also doing much better since losing weight and working on HTN management. She would like to start weaning off the Celexa because she doesn't feel like she needs it anymore. No SI/HI.  ? ? ?HYPERTENSION: ?- Medications: HCTZ 12.5 mg daily  ?- Compliance: good ?- Checking BP at home: 110-130/80s  ?- Denies any SOB, recurrent headaches, CP, vision changes, LE edema, dizziness, palpitations, or medication side effects. ?- Diet: low-sodium, portion control  ?- Exercise: 30 minutes daily ? ? ? ? ? ? ?ROS ?All review of systems negative except what is listed in the HPI ? ?  ?Objective:  ?  ? ?BP 113/79   Pulse 87   Ht 5' (1.524 m)   Wt 194 lb 9.6 oz (88.3 kg)   BMI 38.01 kg/m?  ? ? ?Physical Exam ?Vitals reviewed.  ?Constitutional:   ?   Appearance: Normal appearance. She is obese.  ?HENT:  ?   Head: Normocephalic and atraumatic.  ?Cardiovascular:  ?   Rate and Rhythm: Normal rate and regular rhythm.  ?Pulmonary:  ?   Effort: Pulmonary effort is normal.  ?   Breath sounds: Normal breath sounds.  ?Musculoskeletal:  ?   Right lower leg: No edema.  ?   Left lower leg: No edema.  ?Neurological:  ?   General: No focal deficit present.  ?   Mental Status: She is alert and oriented to person, place, and time. Mental status is at  baseline.  ?Psychiatric:     ?   Mood and Affect: Mood normal.     ?   Behavior: Behavior normal.     ?   Thought Content: Thought content normal.     ?   Judgment: Judgment normal.  ? ? ? ?No results found for any visits on 06/03/21. ? ? ? ?The ASCVD Risk score (Arnett DK, et al., 2019) failed to calculate for the following reasons: ?  The 2019 ASCVD risk score is only valid for ages 35 to 70 ? ?  ?Assessment & Plan:  ? ?Problem List Items Addressed This Visit   ? ?  ? Cardiovascular and Mediastinum  ? Primary hypertension - Primary  ?  Blood pressure is at goal for age and co-morbidities.  I recommend continuing HCTZ 12.5 mg daily. Start checking your blood pressure in the morning before taking your medication; if < 110 on the top number, wait a few hours and recheck before taking your medication. I don't want you to start dropping too low and feeling bad. If you are borderline and still hesitant, you could always start by taking a half tablet and rechecking BP in a few hours.   ?- BP goal <130/80 ?- monitor and log blood pressures at home ?-  check around the same time each day in a relaxed setting ?- Limit salt to <2000 mg/day ?- Follow DASH eating plan (heart healthy diet) ?- limit alcohol to 2 standard drinks per day for men and 1 per day for women ?- avoid tobacco products ?- get at least 2 hours of regular aerobic exercise weekly ?Patient aware of signs/symptoms requiring further/urgent evaluation. ?Labs updated today. ?  ?  ? Relevant Orders  ? CBC with Differential/Platelet  ? Comprehensive metabolic panel  ? Ambulatory referral to Nutrition and Diabetic Education  ?  ? Other  ? Anxiety and depression  ?  Doing really good and would like to try coming off of Celexa as she doesn't think she needs it. Recommend she cut tablet in half for the next 1-2 weeks then can try to stop. No SI/HI. ? ?  ?  ? ?Other Visit Diagnoses   ? ? Hematuria, unspecified type      ? Relevant Orders  ? Urinalysis, Routine w reflex  microscopic  ? ?  ?Hematuria at ED visit last month, rechecking today to ensure resolution. Asymptomatic.  ? ?Return in about 6 months (around 12/04/2021) for routine f/u .  ? ? ?Terrilyn Saver, NP ? ?

## 2021-06-06 NOTE — Addendum Note (Signed)
Addended by: Caleen Jobs B on: 06/06/2021 10:20 AM ? ? Modules accepted: Orders ? ?

## 2021-06-07 ENCOUNTER — Telehealth: Payer: Self-pay | Admitting: Family Medicine

## 2021-06-07 NOTE — Telephone Encounter (Signed)
Patient would like a call back to discuss her blood pressure medication. Please advise.  ?

## 2021-06-07 NOTE — Telephone Encounter (Signed)
Spoke with pt.  She is wanting to know if she needs to continue taking her bp meds because this morning before any meds she got 110/79, then 115/79 less than an hour later, still no meds.  Please advise. ?

## 2021-06-08 ENCOUNTER — Ambulatory Visit: Payer: Self-pay | Admitting: Emergency Medicine

## 2021-06-08 NOTE — Telephone Encounter (Signed)
Pt notified of provider recommendations and voiced understanding. ? ?

## 2021-06-17 NOTE — Addendum Note (Signed)
Addended by: Kelle Darting A on: 06/17/2021 02:13 PM   Modules accepted: Orders

## 2021-06-20 ENCOUNTER — Other Ambulatory Visit (INDEPENDENT_AMBULATORY_CARE_PROVIDER_SITE_OTHER): Payer: Self-pay

## 2021-06-20 DIAGNOSIS — R319 Hematuria, unspecified: Secondary | ICD-10-CM

## 2021-06-20 DIAGNOSIS — N3090 Cystitis, unspecified without hematuria: Secondary | ICD-10-CM

## 2021-06-20 LAB — URINALYSIS, ROUTINE W REFLEX MICROSCOPIC
Bilirubin Urine: NEGATIVE
Ketones, ur: NEGATIVE
Nitrite: NEGATIVE
Specific Gravity, Urine: >=1.03 — AB (ref 1.000–1.030)
Total Protein, Urine: NEGATIVE
Urine Glucose: NEGATIVE
Urobilinogen, UA: 0.2 (ref 0.0–1.0)
pH: 6 (ref 5.0–8.0)

## 2021-06-21 MED ORDER — SULFAMETHOXAZOLE-TRIMETHOPRIM 800-160 MG PO TABS: 1.0000 | ORAL_TABLET | Freq: Two times a day (BID) | ORAL | 0 refills | Status: AC

## 2021-06-21 NOTE — Addendum Note (Signed)
Addended by: Caleen Jobs B on: 06/21/2021 05:10 PM   Modules accepted: Orders

## 2021-06-30 ENCOUNTER — Encounter: Payer: Self-pay | Attending: Family Medicine | Admitting: Skilled Nursing Facility1

## 2021-07-04 ENCOUNTER — Telehealth: Payer: Self-pay | Admitting: Family Medicine

## 2021-07-04 ENCOUNTER — Encounter (HOSPITAL_COMMUNITY): Payer: Self-pay

## 2021-07-04 ENCOUNTER — Emergency Department (HOSPITAL_COMMUNITY)
Admission: EM | Admit: 2021-07-04 | Discharge: 2021-07-05 | Discharge status: Against Medical Advice | Payer: Self-pay | Attending: Emergency Medicine | Admitting: Emergency Medicine

## 2021-07-04 DIAGNOSIS — F410 Panic disorder [episodic paroxysmal anxiety] without agoraphobia: Secondary | ICD-10-CM | POA: Insufficient documentation

## 2021-07-04 DIAGNOSIS — R202 Paresthesia of skin: Secondary | ICD-10-CM | POA: Insufficient documentation

## 2021-07-04 DIAGNOSIS — R42 Dizziness and giddiness: Secondary | ICD-10-CM | POA: Insufficient documentation

## 2021-07-04 DIAGNOSIS — Z5321 Procedure and treatment not carried out due to patient leaving prior to being seen by health care provider: Secondary | ICD-10-CM | POA: Insufficient documentation

## 2021-07-04 NOTE — ED Provider Triage Note (Signed)
Emergency Medicine Provider Triage Evaluation Note  Kalei Meda , a 31 y.o. female  was evaluated in triage.  Pt complains of mouth tingling, right arm tingling, dizziness.  Patient states the symptoms began today on her way to work.  Patient has history of anxiety, she states that in the past her anxiety attacks have presented similarly.  Patient denies any numbness, tingling on examination.  Patient denies any slurred speech, facial droop.  Review of Systems  Positive:  Negative:   Physical Exam  BP (!) 144/96 (BP Location: Left Arm)   Pulse 91   Temp 98.3 F (36.8 C) (Oral)   Resp 18   Ht 5' (1.524 m)   Wt 86.2 kg   SpO2 100%   BMI 37.11 kg/m  Gen:   Awake, no distress   Resp:  Normal effort  MSK:   Moves extremities without difficulty  Other:  5 out of 5 strength upper and lower extremities bilaterally.  No focal neurodeficits on examination.  Medical Decision Making  Medically screening exam initiated at 4:56 PM.  Appropriate orders placed.  Lavell Hawthorne was informed that the remainder of the evaluation will be completed by another provider, this initial triage assessment does not replace that evaluation, and the importance of remaining in the ED until their evaluation is complete.     Azucena Cecil, PA-C 07/04/21 1658

## 2021-07-04 NOTE — Telephone Encounter (Signed)
Who Is Calling Patient / Member / Family / Caregiver Call Type Triage / Clinical Relationship To Patient Self Return Phone Number [phone] (Primary) Chief Complaint NUMBNESS/TINGLING- sudden on one side of the body or face Reason for Call Symptomatic / Request for [name] states she is light headed. Her heart rate is elevated. One of her hands is feeling numb. Translation No Nurse Assessment Nurse: Roselyn Reef, RN, [name] Date/Time (Eastern Time): 07/04/2021 3:20:33 PM Confirm and document reason for call. If symptomatic, describe symptoms. ---Caller states she was feeling light headed. Her heart rate is elevated. One of her hands is feeling numb. States this happened around 2:30. States she is feeling better right now but her hr goes up to 112 when she is sitting. States the symptoms started when she was on her way to work. States this has happened before and is usually related to her BP. States she checked her BP a few minutes ago and it was 147/101. Did not take BP medication today 07/04/2021 3:31:29 PM Go to ED Now (or PCP triage) Yes Roselyn Reef, RN, [facility]

## 2021-07-04 NOTE — Telephone Encounter (Signed)
Pt called to discuss getting back on bp medication. She stated today she had a dizzy spell with some numbness but went away. Her heart beat is also slightly elevated. Scheduled her Wednesday with pcp and transferred to triage.

## 2021-07-04 NOTE — ED Triage Notes (Signed)
Pt arrived via POV, c/o right hand tingling and dizziness earlier today. States this is common with her anxiety, she did not have her anxiety meds today. Denies any sx currently.

## 2021-07-04 NOTE — Telephone Encounter (Signed)
Pt in ED now.

## 2021-07-06 ENCOUNTER — Ambulatory Visit (INDEPENDENT_AMBULATORY_CARE_PROVIDER_SITE_OTHER): Payer: Self-pay | Admitting: Family Medicine

## 2021-07-06 ENCOUNTER — Encounter: Payer: Self-pay | Admitting: Family Medicine

## 2021-07-06 VITALS — BP 121/78 | HR 80 | Ht 60.0 in | Wt 188.6 lb

## 2021-07-06 DIAGNOSIS — F419 Anxiety disorder, unspecified: Secondary | ICD-10-CM

## 2021-07-06 DIAGNOSIS — I10 Essential (primary) hypertension: Secondary | ICD-10-CM

## 2021-07-06 DIAGNOSIS — F32A Depression, unspecified: Secondary | ICD-10-CM

## 2021-07-06 NOTE — Progress Notes (Signed)
Acute Office Visit  Subjective:     Patient ID: [name], female    DOB: 1990/05/30, 31 y.o.   MRN: [id number]  CC: ED f/u   HPI Patient is in today for ED f/u.   She was in the ED on 07/04/21 for tingling to face and right hand with elevated BP.   Reports she was driving to work and started getting anxious. Reports it came out of nowhere and her mouth and right hand started tingling. She didn't have her hydroxyzine with her so she was trying to wait for it to ease off. Went to the ED but they were taking awhile to get her seen, so she ended up leaving because she was feeling normal within 30 minutes of being there.   States the anxiety just started randomly on the way to work; states those instances are rare for her so she doesn't always keep her hydroxyzine on hand.  She came off of her Celexa a few months ago because she thought she was doing better and didn't need it. She would like to try to stay off of the Celexa bc she has been doing so good overall - states she has been managing very well with exercise, lifestyle, etc.    HYPERTENSION: - Medications: none - stopped HCTZ at last appt because readings were starting to drop low - Compliance: n/a, lifestyle modifications only - Checking BP at home: yes, < 130/80 - Denies any SOB, recurrent headaches, CP, vision changes, LE edema, dizziness, palpitations, or medication side effects. - Diet: DASH diet - very strict - Exercise: walking at least 20 minutes a day in addition to going to the gym almost every day; she has lost over 40 pounds in the past 2 months - states she has been working really hard.     07/06/2021   11:05 AM 12/22/2019    2:00 PM 06/09/2016   11:00 AM  PHQ9 SCORE ONLY  PHQ-9 Total Score '1 24 22       '$ 07/06/2021   11:05 AM 12/22/2019    2:01 PM 06/09/2016   11:00 AM  GAD 7 : Generalized Anxiety Score  Nervous, Anxious, on Edge '1 2 2  '$ Control/stop worrying 0 3 2  Worry too much - different things '1 3  3  '$ Trouble relaxing 0 3 3  Restless 0 3 3  Easily annoyed or irritable 0 3 3  Afraid - awful might happen '1 3 3  '$ Total GAD 7 Score '3 20 19  '$ Anxiety Difficulty Somewhat difficult Somewhat difficult        ROS All review of systems negative except what is listed in the HPI      Objective:    BP 121/78   Pulse 80   Ht 5' (1.524 m)   Wt 188 lb 9.6 oz (85.5 kg)   BMI 36.83 kg/m    Physical Exam Vitals reviewed.  Constitutional:      Appearance: Normal appearance.  HENT:     Head: Normocephalic and atraumatic.  Cardiovascular:     Rate and Rhythm: Normal rate and regular rhythm.  Pulmonary:     Effort: Pulmonary effort is normal.     Breath sounds: Normal breath sounds.  Skin:    General: Skin is warm and dry.  Neurological:     General: No focal deficit present.     Mental Status: She is alert and oriented to person, place, and time. Mental status is at baseline.  Psychiatric:  Mood and Affect: Mood normal.        Behavior: Behavior normal.        Thought Content: Thought content normal.        Judgment: Judgment normal.    No results found for any visits on 07/06/21.      Assessment & Plan:   1. Anxiety and depression PHQ9/GAD7 acceptable overall. Discussed options with patient and she would really like to stay off of the Celexa if possible. States she has been doing really well overall and thinks the anxiety attack was a rare event. She will try to get better at keeping her hydroxyzine accessible for these rare instances. No SI/HI. Patient aware of signs/symptoms requiring further/urgent evaluation.    2. Primary hypertension Stable with lifestyle measures alone. No need to restart medication at this time. Recommend she continue DASH diet, regular exercise, stress management, etc. Patient aware of signs/symptoms requiring further/urgent evaluation.    Return in about 6 weeks (around 08/17/2021) for mood, BP f/u . - She would like to touch base again  in July before I go out for maternity leave to ensure BP and mood are still well controlled.   Terrilyn Saver, NP

## 2021-07-09 ENCOUNTER — Encounter (HOSPITAL_BASED_OUTPATIENT_CLINIC_OR_DEPARTMENT_OTHER): Payer: Self-pay | Admitting: Emergency Medicine

## 2021-07-09 ENCOUNTER — Other Ambulatory Visit: Payer: Self-pay

## 2021-07-09 ENCOUNTER — Emergency Department (HOSPITAL_BASED_OUTPATIENT_CLINIC_OR_DEPARTMENT_OTHER)
Admission: EM | Admit: 2021-07-09 | Discharge: 2021-07-10 | Disposition: A | Discharge status: Home / Self Care | Payer: No Typology Code available for payment source | Attending: Emergency Medicine | Admitting: Emergency Medicine

## 2021-07-09 DIAGNOSIS — W461XXA Contact with contaminated hypodermic needle, initial encounter: Secondary | ICD-10-CM | POA: Insufficient documentation

## 2021-07-09 DIAGNOSIS — Z7721 Contact with and (suspected) exposure to potentially hazardous body fluids: Secondary | ICD-10-CM | POA: Diagnosis present

## 2021-07-09 DIAGNOSIS — I10 Essential (primary) hypertension: Secondary | ICD-10-CM | POA: Diagnosis not present

## 2021-07-09 DIAGNOSIS — Z794 Long term (current) use of insulin: Secondary | ICD-10-CM | POA: Insufficient documentation

## 2021-07-09 HISTORY — DX: Essential (primary) hypertension: I10

## 2021-07-09 NOTE — ED Triage Notes (Signed)
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 She reports cleansing the area with soap and water immediately after injury. She reports that the resident involved has no known bloodborne diseases.

## 2021-07-10 LAB — HEPATITIS PANEL, ACUTE
HCV Ab: NONREACTIVE
Hep A IgM: NONREACTIVE
Hep B C IgM: NONREACTIVE
Hepatitis B Surface Ag: NONREACTIVE

## 2021-07-10 LAB — RAPID HIV SCREEN (HIV 1/2 AB+AG)
HIV 1/2 Antibodies: NONREACTIVE
HIV-1 P24 Antigen - HIV24: NONREACTIVE

## 2021-07-10 NOTE — ED Notes (Signed)
Patient denies any complaints, here for post needle stick exposure work up.

## 2021-07-10 NOTE — ED Provider Notes (Signed)
[facility] Provider Note: Georgena Spurling, MD, FACEP  CSN: [id number] MRN: [id number] ARRIVAL: 07/09/21 at 2335 ROOM: MH06/MH06   CHIEF COMPLAINT  Body Fluid Exposure   HISTORY OF PRESENT ILLNESS  07/10/21 12:16 AM [name] is a 31 y.o. 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  She stuck her her right index finger.  She washed the area with soap and water immediately after injury.  There is a small puncture wound at the site with negligible discomfort.  The patient is not known to have a history of blood-borne disease.   Past Medical History:  Diagnosis Date   Anxiety    Cardiomyopathy    Depression    Facial angiofibroma    Heart palpitations    Hypertension    Irregular heartbeat    Nosebleed     Past Surgical History:  Procedure Laterality Date   NASAL HEMORRHAGE CONTROL Bilateral 08/28/2012   Procedure: EPISTAXIS CONTROL;  Surgeon: Jerrell Belfast, MD;  Location: [facility];  Service: ENT;  Laterality: Bilateral;    Family History  Problem Relation Age of Onset   Heart disease Father        Unknown type    Social History   Tobacco Use   Smoking status: Never   Smokeless tobacco: Never  Vaping Use   Vaping Use: Never used  Substance Use Topics   Alcohol use: No   Drug use: No    Prior to Admission medications   Medication Sig Start Date End Date Taking? Authorizing Provider  hydrOXYzine (ATARAX) 25 MG tablet Take 1 tablet (25 mg total) by mouth every 6 (six) hours. 04/14/21   Sherrill Raring, PA-C  Melatonin 3 MG CAPS Take 1 capsule (3 mg total) by mouth at bedtime as needed. 12/03/15   Barrett, Evelene Croon, PA-C  potassium chloride SA (KLOR-CON M) 20 MEQ tablet Take 2 tablets (40 mEq total) by mouth 2 (two) times daily. 04/14/21   Sherrill Raring, PA-C    Allergies Patient has no known allergies.   REVIEW OF SYSTEMS  Negative except as noted here or in  the History of Present Illness.   PHYSICAL EXAMINATION  Initial Vital Signs Blood pressure (!) 126/98, pulse 76, temperature 98.1 F (36.7 C), temperature source Oral, resp. rate 16, height 5' (1.524 m), weight 85.3 kg, last menstrual period 06/28/2021, SpO2 100 %.  Examination General: Well-developed, well-nourished female in no acute distress; appearance consistent with age of record HENT: normocephalic; atraumatic Eyes: Normal appearance Neck: supple Heart: regular rate and rhythm Lungs: clear to auscultation bilaterally Abdomen: soft; nondistended; nontender; bowel sounds present Extremities: No deformity; full range of motion Neurologic: Awake, alert and oriented; motor function intact in all extremities and symmetric; no facial droop Skin: Warm and dry; tiny puncture wound middle phalanx of right index finger Psychiatric: Normal mood and affect   RESULTS  Summary of this visit's results, reviewed and interpreted by myself:   EKG Interpretation  Date/Time:    Ventricular Rate:    PR Interval:    QRS Duration:   QT Interval:    QTC Calculation:   R Axis:     Text Interpretation:         Laboratory Studies: Results for orders placed or performed during the hospital encounter of 07/09/21 (from the past 24 hour(s))  Rapid HIV screen (HIV 1/2 Ab+Ag)     Status: None  Collection Time: 07/09/21 11:50 PM  Result Value Ref Range   HIV-1 P24 Antigen - HIV24 NON REACTIVE NON REACTIVE   HIV 1/2 Antibodies NON REACTIVE NON REACTIVE   Interpretation (HIV Ag Ab)      A non reactive test result means that HIV 1 or HIV 2 antibodies and HIV 1 p24 antigen were not detected in the specimen.   Imaging Studies: No results found.  ED COURSE and MDM  Nursing notes, initial and subsequent vitals signs, including pulse oximetry, reviewed and interpreted by myself.  Vitals:   07/09/21 2346 07/10/21 0014  BP:  (!) 126/98  Pulse:  76  Resp:  16  Temp:  98.1 F (36.7 C)   TempSrc:  Oral  SpO2:  100%  Weight: 85.3 kg   Height: 5' (1.524 m)    Medications - No data to display  1:50 AM The current CDC guidelines for postexposure prophylaxis were discussed with the patient.  The benefits and risks of starting anti-HIV drugs were discussed.  The patient does not wish to take the drugs as she is skittish of the side effects.  She also believes this was a low risk needlestick as the needle was not visibly bloody, the puncture on her finger was not deep and the patient is not known to have any HIV risk factors.  PROCEDURES  Procedures   ED DIAGNOSES     ICD-10-CM   1. Needlestick injury accident with exposure to body fluid  W46.1XXA          Aivan Fillingim, Jenny Reichmann, MD 07/10/21 0151

## 2021-08-08 ENCOUNTER — Telehealth: Payer: Self-pay | Admitting: Family Medicine

## 2021-08-08 NOTE — Telephone Encounter (Signed)
Pt is scheduled with pcp tomorrow.

## 2021-08-08 NOTE — Telephone Encounter (Signed)
Appt tomorrow.

## 2021-08-08 NOTE — Telephone Encounter (Signed)
Nurse Assessment Nurse: Susy Manor, RN, Megan Date/Time (Eastern Time): 08/08/2021 10:59:42 AM Confirm and document reason for call. If symptomatic, describe symptoms. ---Caller states she has been experiencing widespread hives after taking an over the counter medication (azo). She has been experiencing yellow, vaginal discharge. Denies difficulty with breathing and swallowing. Does the patient have any new or worsening symptoms? ---Yes Will a triage be completed? ---Yes Related visit to physician within the last 2 weeks? ---No Does the PT have any chronic conditions? (i.e. diabetes, asthma, this includes High risk factors for pregnancy, etc.) ---Yes List chronic conditions. ---HTN Is the patient pregnant or possibly pregnant? (Ask all females between the ages of 47-55) ---No Is this a behavioral health or substance abuse call? ---No Guidelines Guideline Title Affirmed Question Affirmed Notes Nurse Date/Time Eilene Ghazi Time) Hives Widespread hives Susy Manor, RN, Jinny Blossom 08/08/2021 11:01:56 AM Vaginal Discharge Genital area looks infected (e.g., draining sore, spreading redness) Susy Manor, RN, Megan 08/08/2021 11:04:55 AM PLEASE NOTE: All timestamps contained within this report are represented as [place] Standard Time. CONFIDENTIALTY NOTICE: This fax transmission is intended only for the addressee. It contains information that is legally privileged, confidential or otherwise protected from use or disclosure. If you are not the intended recipient, you are strictly prohibited from reviewing, disclosing, copying using or disseminating any of this information or taking any action in reliance on or regarding this information. If you have received this fax in error, please notify us immediately by telephone so that we can arrange for its return to [place]. Phone: [phone], Toll-Free: [phone], Fax: [phone] Page: 2 of 2 Call Id: [id number] [place]. Time Eilene Ghazi Time) Disposition Final  User 08/08/2021 11:04:36 [place], RN, Jinny Blossom 08/08/2021 11:09:20 AM See PCP within 24 Hours Yes Susy Manor, RN, Jinny Blossom Final Disposition 08/08/2021 11:09:20 AM See PCP within 24 Hours Yes Susy Manor, RN, Jinny Blossom Caller Disagree/Comply Comply Caller Understands Yes PreDisposition Call Doctor Care Advice Given Per Guideline HOME CARE: * You should be able to treat this at home. REASSURANCE AND EDUCATION - WIDESPREAD HIVES: * It sounds like mild hives. * Diphenhydramine (Benadryl) is a FIRST GENERATION ANTIHISTAMINE medicine. It can make you more sleepy than the newer second generation antihistamine medicines. The adult dose of Benadryl is 25 to 50 mg by mouth. You can take it up to 4 times a day. ANTIHISTAMINE MEDICINES FOR WIDESPREAD HIVES: * FEXOFENADINE (ALLEGRA): In the [place], the adult dose is one 24-hour tablet (180 mg) once a day. In [place], the adult dose is one 24-hour tablet (120 mg) once a day. Or, you can take one 12-hour (60 mg) tablet 2 times a day. * CETIRIZINE (REACTINE, ZYRTEC): The adult dose is 10 mg. You take it once a day. Cetirizine is available in the [place] as Zyrtec and in [place] as Reactine. * LORATADINE (ALAVERT, CLARITIN): The adult dose is 10 mg. You take it once a day. Loratadine is available in the [place] as Engineer, maintenance (IT); it is available in [place] as Claritin. COOL BATH FOR ITCHING: * Take a cool bath for 10 minutes to relieve itching. (Caution: avoid any chill). CALL BACK IF: * Severe hives or severe itching persist over 24 hours despite taking an antihistamine * Hives last over 1 week * You become worse CARE ADVICE given per Hives (Adult) guideline. SEE PCP WITHIN 24 HOURS: * IF OFFICE WILL BE OPEN: You need to be examined within the next 24 hours. Call your doctor (or NP/PA) when the office opens and make an appointment. CARE  ADVICE given per Vaginal Discharge (Adult) guideline. CALL BACK IF: * You become worse Comments User:  Sula Soda, RN Date/Time (Eastern Time): 08/08/2021 11:05:51 AM vaginal discharge started 3days after having intercourse with someone, discharge is yellow, itching Referrals REFERRED TO PCP OFFICE

## 2021-08-08 NOTE — Telephone Encounter (Signed)
Pt called in worried about a mediation reaction. She stated she took [place] for a uti, and nopw is broken out in hives. Transferred to triage.

## 2021-08-09 ENCOUNTER — Ambulatory Visit (INDEPENDENT_AMBULATORY_CARE_PROVIDER_SITE_OTHER): Payer: Self-pay | Admitting: Family Medicine

## 2021-08-09 ENCOUNTER — Encounter: Payer: Self-pay | Admitting: Family Medicine

## 2021-08-09 ENCOUNTER — Other Ambulatory Visit (HOSPITAL_COMMUNITY)
Admission: RE | Admit: 2021-08-09 | Discharge: 2021-08-09 | Disposition: A | Discharge status: Home / Self Care | Payer: Self-pay | Source: Ambulatory Visit | Attending: Family Medicine | Admitting: Family Medicine

## 2021-08-09 VITALS — BP 118/70 | HR 94 | Ht 60.0 in | Wt 181.0 lb

## 2021-08-09 DIAGNOSIS — A599 Trichomoniasis, unspecified: Secondary | ICD-10-CM

## 2021-08-09 DIAGNOSIS — N926 Irregular menstruation, unspecified: Secondary | ICD-10-CM

## 2021-08-09 DIAGNOSIS — N898 Other specified noninflammatory disorders of vagina: Secondary | ICD-10-CM | POA: Insufficient documentation

## 2021-08-09 DIAGNOSIS — R300 Dysuria: Secondary | ICD-10-CM

## 2021-08-09 DIAGNOSIS — R82998 Other abnormal findings in urine: Secondary | ICD-10-CM

## 2021-08-09 LAB — POC URINALSYSI DIPSTICK (AUTOMATED)
Bilirubin, UA: NEGATIVE
Glucose, UA: NEGATIVE
Ketones, UA: NEGATIVE
Nitrite, UA: NEGATIVE
Protein, UA: NEGATIVE
Spec Grav, UA: 1.025 (ref 1.010–1.025)
Urobilinogen, UA: 0.2 E.U./dL
pH, UA: 5 (ref 5.0–8.0)

## 2021-08-09 LAB — POCT URINE PREGNANCY: Preg Test, Ur: NEGATIVE

## 2021-08-09 NOTE — Progress Notes (Signed)
Vaginal itching/discharge x1 week

## 2021-08-09 NOTE — Progress Notes (Signed)
   Acute Office Visit  Subjective:     Patient ID: [name], female    DOB: 30-Jul-1990, 31 y.o.   MRN: [id number]  CC: vaginal discharge/itching   HPI Patient is in today for vaginal discharge/itching.  Patient reports one week of vaginal itching and discharge. Reports the discharge has been yellow tinted, but yesterday there was some blood mixed in. She has had some occasional dysuria, but not consistent. She is sexually active with one new female partner for the past few weeks, without protection. Reports she attempted OTC Azo Yeast + tablets, but ended up with some hives to her legs and arms the next day (she has tolerated that in the past, so unsure if other trigger, but the rash has completely resolved).  She denies any pain, swelling, lesions, abdominal/flank/back pain, fevers, chills, body aches, odor changes.  LMP was 07/07/21, but reports periods are irregular so not sure when she is supposed to start. No birth control.     ROS All review of systems negative except what is listed in the HPI      Objective:    BP 118/70   Pulse 94   Ht 5' (1.524 m)   Wt 181 lb (82.1 kg)   LMP 06/28/2021   BMI 35.35 kg/m    Physical Exam Vitals reviewed.  Constitutional:      Appearance: Normal appearance.  Genitourinary:    Comments: Deferred, self swab Skin:    General: Skin is warm and dry.  Neurological:     General: No focal deficit present.     Mental Status: She is alert and oriented to person, place, and time. Mental status is at baseline.  Psychiatric:        Mood and Affect: Mood normal.        Behavior: Behavior normal.        Thought Content: Thought content normal.        Judgment: Judgment normal.       Results for orders placed or performed in visit on 08/09/21  POCT Urinalysis Dipstick (Automated)  Result Value Ref Range   Color, UA yellow    Clarity, UA clear    Glucose, UA Negative Negative   Bilirubin, UA neg    Ketones, UA neg    Spec  Grav, UA 1.025 1.010 - 1.025   Blood, UA moderate    pH, UA 5.0 5.0 - 8.0   Protein, UA Negative Negative   Urobilinogen, UA 0.2 0.2 or 1.0 E.U./dL   Nitrite, UA neg    Leukocytes, UA Small (1+) (A) Negative  POCT urine pregnancy  Result Value Ref Range   Preg Test, Ur Negative Negative        Assessment & Plan:   1. Vaginal discharge 2. Dysuria 3. Late period 4. Leukocytes in urine Urine pregnancy test is negative.  UA with moderate blood and small leuks - will send for culture.  Self-swab for STD, yeast, BV screen. Patient aware of signs/symptoms requiring further/urgent evaluation.   - Cervicovaginal ancillary only - POCT Urinalysis Dipstick (Automated) - POCT urine pregnancy - Urine Culture   Return if symptoms worsen or fail to improve.  Terrilyn Saver, NP

## 2021-08-10 LAB — URINE CULTURE
MICRO NUMBER:: 13631259
Result:: NO GROWTH
SPECIMEN QUALITY:: ADEQUATE

## 2021-08-10 LAB — CERVICOVAGINAL ANCILLARY ONLY
Bacterial Vaginitis (gardnerella): NEGATIVE
Candida Glabrata: NEGATIVE
Candida Vaginitis: NEGATIVE
Chlamydia: NEGATIVE
Comment: NEGATIVE
Comment: NEGATIVE
Comment: NEGATIVE
Comment: NEGATIVE
Comment: NEGATIVE
Comment: NORMAL
Neisseria Gonorrhea: NEGATIVE
Trichomonas: POSITIVE — AB

## 2021-08-11 MED ORDER — METRONIDAZOLE 500 MG PO TABS: 500.0000 mg | ORAL_TABLET | Freq: Two times a day (BID) | ORAL | 0 refills | Status: AC

## 2021-08-11 NOTE — Progress Notes (Signed)
You tested positive for trichomonas.  Sending in antibiotics (metronidazole) for you. Do not use alcohol 24 hours before or 48 hours after the antibiotics.  Recommend you abstain from sex until treatment is completed and symptoms have resolved. Partners should also be tested/treated. Guidelines suggest resting women with trich in about 3 months. Please schedule a follow-up to have this done.

## 2021-08-11 NOTE — Addendum Note (Signed)
Addended by: Caleen Jobs B on: 08/11/2021 08:53 AM   Modules accepted: Orders

## 2021-08-17 ENCOUNTER — Telehealth: Payer: Self-pay | Admitting: *Deleted

## 2021-08-17 NOTE — Telephone Encounter (Signed)
Pt notified of provider recommendations.

## 2021-08-17 NOTE — Telephone Encounter (Signed)
Pt called today stating she started her period yesterday and it was very heavy and made her dizzy.  She isn't feeling as dizzy today but her flow is still pretty heavy.  She wants to know if this normal as she is aging or if she should be concerned. Please advise.

## 2021-08-19 ENCOUNTER — Ambulatory Visit: Payer: Self-pay | Admitting: Family Medicine

## 2021-08-25 ENCOUNTER — Other Ambulatory Visit: Payer: Self-pay

## 2021-08-25 ENCOUNTER — Encounter (HOSPITAL_BASED_OUTPATIENT_CLINIC_OR_DEPARTMENT_OTHER): Payer: Self-pay

## 2021-08-25 ENCOUNTER — Emergency Department (HOSPITAL_BASED_OUTPATIENT_CLINIC_OR_DEPARTMENT_OTHER)
Admission: EM | Admit: 2021-08-25 | Discharge: 2021-08-25 | Disposition: A | Discharge status: Home / Self Care | Payer: Self-pay | Attending: Emergency Medicine | Admitting: Emergency Medicine

## 2021-08-25 DIAGNOSIS — R1031 Right lower quadrant pain: Secondary | ICD-10-CM | POA: Insufficient documentation

## 2021-08-25 LAB — COMPREHENSIVE METABOLIC PANEL
ALT: 21 U/L (ref 0–44)
AST: 18 U/L (ref 15–41)
Albumin: 4.4 g/dL (ref 3.5–5.0)
Alkaline Phosphatase: 104 U/L (ref 38–126)
Anion gap: 8 (ref 5–15)
BUN: 7 mg/dL (ref 6–20)
CO2: 26 mmol/L (ref 22–32)
Calcium: 9.3 mg/dL (ref 8.9–10.3)
Chloride: 104 mmol/L (ref 98–111)
Creatinine, Ser: 0.66 mg/dL (ref 0.44–1.00)
GFR, Estimated: >60 mL/min (ref >60)
Glucose, Bld: 93 mg/dL (ref 70–99)
Potassium: 3.6 mmol/L (ref 3.5–5.1)
Sodium: 138 mmol/L (ref 135–145)
Total Bilirubin: 0.5 mg/dL (ref 0.3–1.2)
Total Protein: 8.1 g/dL (ref 6.5–8.1)

## 2021-08-25 LAB — URINALYSIS, ROUTINE W REFLEX MICROSCOPIC
Bilirubin Urine: NEGATIVE
Glucose, UA: NEGATIVE mg/dL
Ketones, ur: NEGATIVE mg/dL
Leukocytes,Ua: NEGATIVE
Nitrite: NEGATIVE
Protein, ur: NEGATIVE mg/dL
Specific Gravity, Urine: >=1.03 (ref 1.005–1.030)
pH: 5.5 (ref 5.0–8.0)

## 2021-08-25 LAB — CBC
HCT: 45.2 % (ref 36.0–46.0)
Hemoglobin: 14.6 g/dL (ref 12.0–15.0)
MCH: 26.9 pg (ref 26.0–34.0)
MCHC: 32.3 g/dL (ref 30.0–36.0)
MCV: 83.2 fL (ref 80.0–100.0)
Platelets: 318 10*3/uL (ref 150–400)
RBC: 5.43 MIL/uL — ABNORMAL HIGH (ref 3.87–5.11)
RDW: 14.6 % (ref 11.5–15.5)
WBC: 10.5 10*3/uL (ref 4.0–10.5)
nRBC: 0 % (ref 0.0–0.2)

## 2021-08-25 LAB — URINALYSIS, MICROSCOPIC (REFLEX): WBC, UA: NONE SEEN WBC/hpf (ref 0–5)

## 2021-08-25 LAB — LIPASE, BLOOD: Lipase: 29 U/L (ref 11–51)

## 2021-08-25 LAB — PREGNANCY, URINE: Preg Test, Ur: NEGATIVE

## 2021-08-25 MED ORDER — ONDANSETRON 4 MG PO TBDP: ORAL_TABLET | ORAL | 0 refills | Status: DC

## 2021-08-25 MED ORDER — KETOROLAC TROMETHAMINE 15 MG/ML IJ SOLN
15.0000 mg | Freq: Once | INTRAMUSCULAR | Status: AC
  Administered 2021-08-25: 15 mg via INTRAVENOUS
  Filled 2021-08-25: qty 1

## 2021-08-25 NOTE — Discharge Instructions (Signed)
As we discussed in the room its less likely that you have appendicitis based on your exam and your history but not completely ruled out.  If your pain gets worse you get a fever or you are unable to eat or drink then please return to the emergency department for repeat evaluation.

## 2021-08-25 NOTE — ED Notes (Signed)
IV placed in triage.

## 2021-08-25 NOTE — ED Provider Notes (Signed)
[facility] EMERGENCY DEPARTMENT Provider Note   CSN: [id number] Arrival date & time: 08/25/21  1722     History  Chief Complaint  Patient presents with   Abdominal Pain    [name] is a 31 y.o. female.  31 yo F with a chief complaint of right lower quadrant abdominal pain.  Has been going on for about 2 hours now.  Pain is in the right lower quadrant seems to come and go.  Is actually gotten a bit better since she has been in the emergency department.  She denies any fevers denies vomiting.  Denies trauma.  Denies urinary symptoms.  Denies flank pain.   Abdominal Pain      Home Medications Prior to Admission medications   Medication Sig Start Date End Date Taking? Authorizing Provider  ondansetron (ZOFRAN-ODT) 4 MG disintegrating tablet '4mg'$  ODT q4 hours prn nausea/vomit 08/25/21  Yes Deno Etienne, DO  hydrOXYzine (ATARAX) 25 MG tablet Take 1 tablet (25 mg total) by mouth every 6 (six) hours. 04/14/21   Sherrill Raring, PA-C  Melatonin 3 MG CAPS Take 1 capsule (3 mg total) by mouth at bedtime as needed. 12/03/15   Barrett, Evelene Croon, PA-C  potassium chloride SA (KLOR-CON M) 20 MEQ tablet Take 2 tablets (40 mEq total) by mouth 2 (two) times daily. 04/14/21   Sherrill Raring, PA-C      Allergies    Patient has no known allergies.    Review of Systems   Review of Systems  Gastrointestinal:  Positive for abdominal pain.    Physical Exam Updated Vital Signs BP (!) 124/91 (BP Location: Right Arm)   Pulse 97   Temp 98.1 F (36.7 C) (Oral)   Resp 16   Ht 5' (1.524 m)   Wt 82.1 kg   LMP 08/16/2021   SpO2 100%   BMI 35.35 kg/m  Physical Exam Vitals and nursing note reviewed.  Constitutional:      General: She is not in acute distress.    Appearance: She is well-developed. She is not diaphoretic.  HENT:     Head: Normocephalic and atraumatic.  Eyes:     Pupils: Pupils are equal, round, and reactive to light.  Cardiovascular:     Rate and Rhythm: Normal rate  and regular rhythm.     Heart sounds: No murmur heard.    No friction rub. No gallop.  Pulmonary:     Effort: Pulmonary effort is normal.     Breath sounds: No wheezing or rales.  Abdominal:     General: There is no distension.     Palpations: Abdomen is soft.     Tenderness: There is no abdominal tenderness.     Comments: No tenderness appreciated on abdominal exam.  No pain with deep palpation in the right upper or right lower quadrant.  Musculoskeletal:        General: No tenderness.     Cervical back: Normal range of motion and neck supple.  Skin:    General: Skin is warm and dry.  Neurological:     Mental Status: She is alert and oriented to person, place, and time.  Psychiatric:        Behavior: Behavior normal.     ED Results / Procedures / Treatments   Labs (all labs ordered are listed, but only abnormal results are displayed) Labs Reviewed  CBC - Abnormal; Notable for the following components:      Result Value   RBC 5.43 (*)  All other components within normal limits  URINALYSIS, ROUTINE W REFLEX MICROSCOPIC - Abnormal; Notable for the following components:   Hgb urine dipstick MODERATE (*)    All other components within normal limits  URINALYSIS, MICROSCOPIC (REFLEX) - Abnormal; Notable for the following components:   Bacteria, UA FEW (*)    All other components within normal limits  LIPASE, BLOOD  COMPREHENSIVE METABOLIC PANEL  PREGNANCY, URINE    EKG None  Radiology No results found.  Procedures Procedures    Medications Ordered in ED Medications  ketorolac (TORADOL) 15 MG/ML injection 15 mg (has no administration in time range)    ED Course/ Medical Decision Making/ A&P                           Medical Decision Making Amount and/or Complexity of Data Reviewed Labs: ordered.  Risk Prescription drug management.   31 yo F with a chief complaint of right lower quadrant abdominal discomfort.  This was colicky and seems of improved.   Lasted for about 2 hours.  She has a benign abdominal exam for me.  Lab work is reassuring, UA negative for infection is independently interpreted by me.  She is not pregnant.  LFTs and lipase are unremarkable.  No leukocytosis.  Shared decision making at bedside.  Risk and benefits of CT imaging discussed.  At this point patient will hold off.  We will have her return for worsening symptoms.  7:06 PM:  I have discussed the diagnosis/risks/treatment options with the patient.  Evaluation and diagnostic testing in the emergency department does not suggest an emergent condition requiring admission or immediate intervention beyond what has been performed at this time.  They will follow up with  PCP. We also discussed returning to the ED immediately if new or worsening sx occur. We discussed the sx which are most concerning (e.g., sudden worsening pain, fever, inability to tolerate by mouth) that necessitate immediate return. Medications administered to the patient during their visit and any new prescriptions provided to the patient are listed below.  Medications given during this visit Medications  ketorolac (TORADOL) 15 MG/ML injection 15 mg (has no administration in time range)     The patient appears reasonably screen and/or stabilized for discharge and I doubt any other medical condition or other [facility] requiring further screening, evaluation, or treatment in the ED at this time prior to discharge.          Final Clinical Impression(s) / ED Diagnoses Final diagnoses:  Right lower quadrant abdominal pain    Rx / DC Orders ED Discharge Orders          Ordered    ondansetron (ZOFRAN-ODT) 4 MG disintegrating tablet        08/25/21 1901              Deno Etienne, DO 08/25/21 1906

## 2021-08-25 NOTE — ED Triage Notes (Signed)
Pt reports right lower abdominal pain since 445pm today

## 2021-11-11 ENCOUNTER — Ambulatory Visit: Payer: Self-pay | Admitting: Family

## 2021-11-11 ENCOUNTER — Telehealth: Payer: Self-pay

## 2021-11-11 NOTE — Telephone Encounter (Signed)
NS Letter sent to pt.

## 2021-11-16 ENCOUNTER — Encounter (HOSPITAL_BASED_OUTPATIENT_CLINIC_OR_DEPARTMENT_OTHER): Payer: Self-pay

## 2021-11-16 ENCOUNTER — Emergency Department (HOSPITAL_BASED_OUTPATIENT_CLINIC_OR_DEPARTMENT_OTHER)
Admission: EM | Admit: 2021-11-16 | Discharge: 2021-11-16 | Disposition: A | Discharge status: Home / Self Care | Payer: Self-pay | Attending: Emergency Medicine | Admitting: Emergency Medicine

## 2021-11-16 DIAGNOSIS — U071 COVID-19: Secondary | ICD-10-CM | POA: Insufficient documentation

## 2021-11-16 DIAGNOSIS — I10 Essential (primary) hypertension: Secondary | ICD-10-CM | POA: Insufficient documentation

## 2021-11-16 LAB — RESP PANEL BY RT-PCR (FLU A&B, COVID) ARPGX2
Influenza A by PCR: NEGATIVE
Influenza B by PCR: NEGATIVE
SARS Coronavirus 2 by RT PCR: POSITIVE — AB

## 2021-11-16 LAB — GROUP A STREP BY PCR: Group A Strep by PCR: NOT DETECTED

## 2021-11-16 MED ORDER — NIRMATRELVIR/RITONAVIR (PAXLOVID)TABLET: 3.0000 | ORAL_TABLET | Freq: Two times a day (BID) | ORAL | 0 refills | Status: AC

## 2021-11-16 NOTE — ED Triage Notes (Signed)
C/o congestion & cough, sore throat x 2 days.

## 2021-11-16 NOTE — Discharge Instructions (Signed)
Please take over-the-counter medications as needed for symptoms.  Return with any trouble breathing, chest pain, other sudden/severe symptoms.

## 2021-11-16 NOTE — ED Provider Notes (Signed)
Emergency Department Provider Note   I have reviewed the triage vital signs and the nursing notes.   HISTORY  Chief Complaint URI   HPI [name] is a 31 y.o. female presents to the emergency department for evaluation of nasal congestion, cough, body aches.  Symptoms began yesterday.  No chest pain or shortness of breath.  No vomiting or diarrhea.  Patient does work at an assisted living facility but states has not been there in several days.  No vomiting or diarrhea.  Has had some mild body aches.  Past Medical History:  Diagnosis Date   Anxiety    Cardiomyopathy    Depression    Facial angiofibroma    Heart palpitations    Hypertension    Irregular heartbeat    Nosebleed     Review of Systems  Constitutional: Positive fever/chills Cardiovascular: Denies chest pain. Respiratory: Denies shortness of breath. Positive cough.  Gastrointestinal: No abdominal pain. Genitourinary: Negative for dysuria. Musculoskeletal: Negative for back pain. Skin: Negative for rash. Neurological: Negative for headaches, focal weakness or numbness.  ____________________________________________   PHYSICAL EXAM:  VITAL SIGNS: ED Triage Vitals  Enc Vitals Group     BP 11/16/21 0748 (!) 129/94     Pulse Rate 11/16/21 0748 94     Resp 11/16/21 0748 18     Temp 11/16/21 0748 98.5 F (36.9 C)     Temp Source 11/16/21 0748 Oral     SpO2 11/16/21 0748 99 %     Weight 11/16/21 0748 168 lb (76.2 kg)     Height 11/16/21 0748 5' (1.524 m)   Constitutional: Alert and oriented. Well appearing and in no acute distress. Eyes: Conjunctivae are normal.  Head: Atraumatic. Nose: Positive congestion/rhinnorhea. Mouth/Throat: Mucous membranes are moist. No tonsillar exudate.  Neck: No stridor.   Cardiovascular: Normal rate, regular rhythm. Good peripheral circulation. Grossly normal heart sounds.   Respiratory: Normal respiratory effort.  No retractions. Lungs CTAB. Gastrointestinal:  Soft and nontender. No distention.  Musculoskeletal: No lower extremity tenderness nor edema. No gross deformities of extremities. Neurologic:  Normal speech and language. No gross focal neurologic deficits are appreciated.  Skin:  Skin is warm, dry and intact. No rash noted.  ____________________________________________   LABS (all labs ordered are listed, but only abnormal results are displayed)  Labs Reviewed  RESP PANEL BY RT-PCR (FLU A&B, COVID) ARPGX2 - Abnormal; Notable for the following components:      Result Value   SARS Coronavirus 2 by RT PCR POSITIVE (*)    All other components within normal limits  GROUP A STREP BY PCR    ____________________________________________   PROCEDURES  Procedure(s) performed:   Procedures  None  ____________________________________________   INITIAL IMPRESSION / ASSESSMENT AND PLAN / ED COURSE  Pertinent labs & imaging results that were available during my care of the patient were reviewed by me and considered in my medical decision making (see chart for details).   This patient is Presenting for Evaluation of URI, which does require a range of treatment options, and is a complaint that involves a high risk of morbidity and mortality.  The Differential Diagnoses includes COVID/Flu, CAP, Strep, pneumonitis, etc   Clinical Laboratory Tests Ordered, included Strep negative. COVID positive.   Social Determinants of Health Risk no smoking history.   Medical Decision Making: Summary:  Patient presents to the emergency department for evaluation of upper respiratory infection symptoms.  Her strep test is negative here and COVID test is pending.  Plan for discharge home and will follow-up with patient should her COVID test come back positive that she would be a candidate for antiviral treatment.  Kidney function in the recent past has been normal.  Called patient back with no answer.  Left a message on her answering machine and sent  Paxlovid to her pharmacy. Advised 5 day quarantine as well.    Disposition: discharge  ____________________________________________  FINAL CLINICAL IMPRESSION(S) / ED DIAGNOSES  Final diagnoses:  COVID-19    Note:  This document was prepared using Dragon voice recognition software and may include unintentional dictation errors.  Nanda Quinton, MD, [facility] Emergency Medicine    Dlynn Ranes, Wonda [facility], MD 11/16/21 [phone]

## 2021-11-25 ENCOUNTER — Other Ambulatory Visit: Payer: Self-pay

## 2021-11-25 ENCOUNTER — Emergency Department (HOSPITAL_BASED_OUTPATIENT_CLINIC_OR_DEPARTMENT_OTHER)
Admission: EM | Admit: 2021-11-25 | Discharge: 2021-11-25 | Disposition: A | Discharge status: Home / Self Care | Payer: Self-pay | Attending: Emergency Medicine | Admitting: Emergency Medicine

## 2021-11-25 DIAGNOSIS — I10 Essential (primary) hypertension: Secondary | ICD-10-CM | POA: Insufficient documentation

## 2021-11-25 DIAGNOSIS — R42 Dizziness and giddiness: Secondary | ICD-10-CM | POA: Insufficient documentation

## 2021-11-25 LAB — CBC
HCT: 43.4 % (ref 36.0–46.0)
Hemoglobin: 13.9 g/dL (ref 12.0–15.0)
MCH: 26.5 pg (ref 26.0–34.0)
MCHC: 32 g/dL (ref 30.0–36.0)
MCV: 82.7 fL (ref 80.0–100.0)
Platelets: 306 10*3/uL (ref 150–400)
RBC: 5.25 MIL/uL — ABNORMAL HIGH (ref 3.87–5.11)
RDW: 14.4 % (ref 11.5–15.5)
WBC: 11 10*3/uL — ABNORMAL HIGH (ref 4.0–10.5)
nRBC: 0 % (ref 0.0–0.2)

## 2021-11-25 LAB — URINALYSIS, ROUTINE W REFLEX MICROSCOPIC
Bilirubin Urine: NEGATIVE
Glucose, UA: NEGATIVE mg/dL
Ketones, ur: NEGATIVE mg/dL
Leukocytes,Ua: NEGATIVE
Nitrite: NEGATIVE
Protein, ur: NEGATIVE mg/dL
Specific Gravity, Urine: 1.017 (ref 1.005–1.030)
pH: 6 (ref 5.0–8.0)

## 2021-11-25 LAB — BASIC METABOLIC PANEL
Anion gap: 10 (ref 5–15)
BUN: 13 mg/dL (ref 6–20)
CO2: 23 mmol/L (ref 22–32)
Calcium: 9.3 mg/dL (ref 8.9–10.3)
Chloride: 103 mmol/L (ref 98–111)
Creatinine, Ser: 0.62 mg/dL (ref 0.44–1.00)
GFR, Estimated: >60 mL/min (ref >60)
Glucose, Bld: 92 mg/dL (ref 70–99)
Potassium: 3.5 mmol/L (ref 3.5–5.1)
Sodium: 136 mmol/L (ref 135–145)

## 2021-11-25 LAB — PREGNANCY, URINE: Preg Test, Ur: NEGATIVE

## 2021-11-25 LAB — CBG MONITORING, ED: Glucose-Capillary: 90 mg/dL (ref 70–99)

## 2021-11-25 NOTE — ED Triage Notes (Signed)
EMS report: Pt BIB [facility] from home c/o dizziness. VS and CBG WDL. Neuro screen negative. Endorses menstrual cycle and decreased PO intake.

## 2021-11-25 NOTE — ED Provider Notes (Signed)
[facility] Emergency Department Provider Note MRN:  [id number]  Arrival date & time: 11/25/21     Chief Complaint   Dizziness   History of Present Illness   [name] is a 31 y.o. year-old female with a history of hypertension presenting to the ED with chief complaint of dizziness.  Dizziness described as room spinning happening a few times today.  Each time it happened right after standing from a seated position.  Also noticed that sometimes when moving her head position.  Has been having heavy periods recently.  Denies any chest pain or shortness of breath, no abdominal pain, no numbness or weakness to the arms or legs, no headache, no vision change, no ear pain or hearing loss.  Review of Systems  A thorough review of systems was obtained and all systems are negative except as noted in the HPI and PMH.   Patient's Health History    Past Medical History:  Diagnosis Date   Anxiety    Cardiomyopathy    Depression    Facial angiofibroma    Heart palpitations    Hypertension    Irregular heartbeat    Nosebleed     Past Surgical History:  Procedure Laterality Date   NASAL HEMORRHAGE CONTROL Bilateral 08/28/2012   Procedure: EPISTAXIS CONTROL;  Surgeon: Jerrell Belfast, MD;  Location: [facility] OR;  Service: ENT;  Laterality: Bilateral;    Family History  Problem Relation Age of Onset   Heart disease Father        Unknown type    Social History   Socioeconomic History   Marital status: Single    Spouse name: N/A   Number of children: 0   Years of education: Not on file   Highest education level: Not on file  Occupational History    Employer: [employer]   Occupation: CNA    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  Family from [place].       Social Determinants of Health   Financial Resource Strain: Not on file  Food Insecurity: Not on file  Transportation Needs: Not on file  Physical Activity: Not on file  Stress: Not on file  Social Connections: Not on file  Intimate Partner Violence: Not on file     Physical Exam   Vitals:   11/25/21 0042 11/25/21 0342  BP: (!) 118/93 (!) 132/98  Pulse: 82 63  Resp: 16 18  Temp: 98.3 F (36.8 C)   SpO2: 98% 99%    CONSTITUTIONAL: Well-appearing, NAD NEURO/PSYCH:  Alert and oriented x 3, normal and symmetric strength and sensation, normal coordination, normal speech EYES:  eyes equal and reactive ENT/NECK:  no LAD, no JVD CARDIO: Regular rate, well-perfused, normal S1 and S2 PULM:  CTAB no wheezing or rhonchi GI/GU:  non-distended, non-tender MSK/SPINE:  No gross deformities, no edema SKIN:  no rash, atraumatic   *Additional and/or pertinent findings included in MDM below  Diagnostic and Interventional Summary    EKG Interpretation  Date/Time:  Friday November 25 2021 00:44:47 EDT Ventricular Rate:  76 PR Interval:  162 QRS Duration: 70 QT Interval:  376 QTC Calculation: 423 R Axis:   36 Text Interpretation: Normal sinus rhythm with sinus arrhythmia Normal ECG When compared with ECG of 09-May-2021 12:46, PREVIOUS ECG IS PRESENT Confirmed by Gerlene Fee [phone]) on 11/25/2021 3:42:44 AM       Labs Reviewed  CBC - Abnormal; Notable for the following components:      Result Value   WBC 11.0 (*)    RBC 5.25 (*)    All other components within normal limits  BASIC METABOLIC PANEL  URINALYSIS, ROUTINE W REFLEX MICROSCOPIC  PREGNANCY, URINE  CBG MONITORING, ED    No orders to display    Medications - No data to display   Procedures  /  Critical Care Procedures  ED Course and Medical Decision Making  Initial Impression and Ddx DDx  considerations include anemia, electrolyte disturbance, arrhythmia, peripheral vertigo, central vertigo, Mnire's, orthostatic hypotension.  Favoring a more benign process given the very reassuring neurological exam, normal vital signs.  Past medical/surgical history that increases complexity of ED encounter: None  Interpretation of Diagnostics I personally reviewed the EKG and my interpretation is as follows: Sinus rhythm without concerning features or intervals  Labs reassuring with no significant blood count or electrolyte disturbance  Patient Reassessment and Ultimate Disposition/Management     Favoring orthostatic hypotension, she does not drink a lot of water, appropriate for discharge with return precautions.  Patient management required discussion with the following services or consulting groups:  None  Complexity of Problems Addressed Acute illness or injury that poses threat of life of bodily function  Additional Data Reviewed and Analyzed Further history obtained from: None  Additional Factors Impacting ED Encounter Risk None  Barth Kirks. Sedonia Small, Roy [email or web address]  Final Clinical Impressions(s) / ED Diagnoses     ICD-10-CM   1. Dizziness  R42       ED Discharge Orders     None        Discharge Instructions Discussed with and Provided to Patient:    Discharge Instructions      You were evaluated in the Emergency Department and after careful evaluation, we did not find any emergent condition requiring admission or further testing in the hospital.  Your exam/testing today is overall reassuring.  Symptoms may be due to dehydration.  Recommend more fluid intake and recommend taking your time when you are changing positions from laying/sitting to standing.  Recommend follow-up with your primary care doctor if symptoms do not improve.  Please return to the Emergency Department if you experience any  worsening of your condition.   Thank you for allowing [place] to be a part of your care.      Maudie Flakes, MD 11/25/21 [phone]

## 2021-11-25 NOTE — ED Triage Notes (Signed)
Pt BIB [facility] c/o dizziness. States she feels like the room is spinning onset yesterday around 3 pm. Pt endorses heavy menstrual cycle x 3 days, change 4 pads in last 24 hours. Denies any additional symptoms. No CP/SOB/Palpitations.

## 2021-11-25 NOTE — Discharge Instructions (Signed)
You were evaluated in the Emergency Department and after careful evaluation, we did not find any emergent condition requiring admission or further testing in the hospital.  Your exam/testing today is overall reassuring.  Symptoms may be due to dehydration.  Recommend more fluid intake and recommend taking your time when you are changing positions from laying/sitting to standing.  Recommend follow-up with your primary care doctor if symptoms do not improve.  Please return to the Emergency Department if you experience any worsening of your condition.   Thank you for allowing [place] to be a part of your care.

## 2021-11-26 ENCOUNTER — Other Ambulatory Visit: Payer: Self-pay

## 2021-11-26 ENCOUNTER — Emergency Department (HOSPITAL_BASED_OUTPATIENT_CLINIC_OR_DEPARTMENT_OTHER)
Admission: EM | Admit: 2021-11-26 | Discharge: 2021-11-26 | Disposition: A | Discharge status: Home / Self Care | Payer: Self-pay | Attending: Emergency Medicine | Admitting: Emergency Medicine

## 2021-11-26 ENCOUNTER — Encounter (HOSPITAL_BASED_OUTPATIENT_CLINIC_OR_DEPARTMENT_OTHER): Payer: Self-pay | Admitting: Emergency Medicine

## 2021-11-26 ENCOUNTER — Emergency Department (HOSPITAL_BASED_OUTPATIENT_CLINIC_OR_DEPARTMENT_OTHER): Payer: Self-pay

## 2021-11-26 DIAGNOSIS — R42 Dizziness and giddiness: Secondary | ICD-10-CM | POA: Insufficient documentation

## 2021-11-26 DIAGNOSIS — Z8616 Personal history of COVID-19: Secondary | ICD-10-CM | POA: Insufficient documentation

## 2021-11-26 NOTE — Discharge Instructions (Addendum)
Follow-up with neurology.  Suspect that this is post-COVID process.  Should resolve on its own.  If you develop any severe headache, vision loss or other concerning symptoms please return for evaluation.  Follow-up with primary care doctor as well.

## 2021-11-26 NOTE — ED Provider Notes (Signed)
[facility] EMERGENCY DEPARTMENT Provider Note   CSN: [id number] Arrival date & time: 11/26/21  1346     History  Chief Complaint  Patient presents with   Dizziness    [name] is a 31 y.o. female.  Patient here with ongoing dizziness for the last several days.  Had work-up last night with no significant findings.  She denies any headache, weakness, numbness, chills.  Had COVID about a week ago.  Denies any cough, sputum production.  She is worried that she could be anemic.  Denies any vision changes or seizures or vision changes.  The history is provided by the patient.       Home Medications Prior to Admission medications   Medication Sig Start Date End Date Taking? Authorizing Provider  hydrOXYzine (ATARAX) 25 MG tablet Take 1 tablet (25 mg total) by mouth every 6 (six) hours. 04/14/21   Sherrill Raring, PA-C  Melatonin 3 MG CAPS Take 1 capsule (3 mg total) by mouth at bedtime as needed. 12/03/15   Barrett, Evelene Croon, PA-C  ondansetron (ZOFRAN-ODT) 4 MG disintegrating tablet '4mg'$  ODT q4 hours prn nausea/vomit 08/25/21   Deno Etienne, DO  potassium chloride SA (KLOR-CON M) 20 MEQ tablet Take 2 tablets (40 mEq total) by mouth 2 (two) times daily. 04/14/21   Sherrill Raring, PA-C      Allergies    Patient has no known allergies.    Review of Systems   Review of Systems  Physical Exam Updated Vital Signs BP (!) 127/101   Pulse 76   Temp 97.9 F (36.6 C) (Oral)   Resp 14   LMP 11/23/2021   SpO2 97%  Physical Exam Vitals and nursing note reviewed.  Constitutional:      General: She is not in acute distress.    Appearance: She is well-developed. She is not ill-appearing.  HENT:     Head: Normocephalic and atraumatic.     Nose: Nose normal.     Mouth/Throat:     Mouth: Mucous membranes are moist.  Eyes:     Extraocular Movements: Extraocular movements intact.     Conjunctiva/sclera: Conjunctivae normal.     Pupils: Pupils are equal, round, and reactive to  light.  Cardiovascular:     Rate and Rhythm: Normal rate and regular rhythm.     Pulses: Normal pulses.     Heart sounds: Normal heart sounds. No murmur heard. Pulmonary:     Effort: Pulmonary effort is normal. No respiratory distress.     Breath sounds: Normal breath sounds.  Abdominal:     General: Abdomen is flat.     Palpations: Abdomen is soft.     Tenderness: There is no abdominal tenderness.  Musculoskeletal:        General: No swelling.     Cervical back: Normal range of motion and neck supple.  Skin:    General: Skin is warm and dry.     Capillary Refill: Capillary refill takes less than 2 seconds.  Neurological:     General: No focal deficit present.     Mental Status: She is alert and oriented to person, place, and time.     Cranial Nerves: No cranial nerve deficit.     Sensory: No sensory deficit.     Motor: No weakness.     Coordination: Coordination normal.     Comments: 5+ out of 5 strength throughout, normal sensation, no drift, normal finger-nose-finger, normal speech  Psychiatric:  Mood and Affect: Mood normal.     ED Results / Procedures / Treatments   Labs (all labs ordered are listed, but only abnormal results are displayed) Labs Reviewed - No data to display  EKG EKG Interpretation  Date/Time:  Saturday November 26 2021 14:03:55 EDT Ventricular Rate:  88 PR Interval:  152 QRS Duration: 76 QT Interval:  342 QTC Calculation: 413 R Axis:   48 Text Interpretation: Normal sinus rhythm Normal ECG When compared with ECG of 25-Nov-2021 00:44, PREVIOUS ECG IS PRESENT Confirmed by Lennice Sites ([id number]) on 11/26/2021 3:21:30 PM  Radiology CT Head Wo Contrast  Result Date: 11/26/2021 CLINICAL DATA:  Dizziness. EXAM: CT HEAD WITHOUT CONTRAST TECHNIQUE: Contiguous axial images were obtained from the base of the skull through the vertex without intravenous contrast. RADIATION DOSE REDUCTION: This exam was performed according to the departmental  dose-optimization program which includes automated exposure control, adjustment of the mA and/or kV according to patient size and/or use of iterative reconstruction technique. COMPARISON:  None Available. 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 No cortical tubers are identified. Otherwise, the brainstem, cerebellum, cerebral peduncles, thalamus, basal ganglia, basilar cisterns, and ventricular system appear within normal limits. No intracranial hemorrhage, mass lesion, or acute CVA. Vascular: Unremarkable Skull: Unremarkable Sinuses/Orbits: Unremarkable Other: No supplemental non-categorized findings. IMPRESSION: 1. No acute intracranial findings. 2. 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 No other specific intracranial findings of tuberous sclerosis are currently identified; if clinically warranted, MRI of the brain can provide enhanced sensitivity for subtle lesions. Electronically Signed   By: Van Clines M.D.   On: 11/26/2021 15:16    Procedures Procedures    Medications Ordered in ED Medications - No data to display  ED Course/ Medical Decision Making/ A&P                           Medical Decision Making Amount and/or Complexity of Data Reviewed Radiology: ordered.   [name] is here with intermittent dizziness over the last several days.  Recent diagnosis of COVID.  Had work-up done yesterday in ED per my review and interpretation of those labs are unremarkable.  We will add a head CT to further work-up.  She has no headache.  She has normal neurological exam.  Suspect differential diagnosis is  post COVID process versus inner ear process.  Seems less likely to be neurologic or stroke related.  However, given her symptoms shared decision was made to get a head CT.  Per radiology report CT scans unremarkable.  Incidentally there are for calcified subependymal nodules along the right lateral ventricle.  These are possibly consistent with tuberosclerosis given that she has had some masses on CT scan of the abdomen about 5 years ago.  Patient's not having any seizures.  No issues from this otherwise.  No headache or vision loss.  Talked with Dr. Curly Shores with neurology who states that she can follow-up with neurology outpatient.  No need for MRI or further work-up at this time.  Recommend rest and hydration.  Discharged in good condition.  This chart was dictated using voice recognition software.  Despite best efforts to proofread,  errors can occur which  can change the documentation meaning.         Final Clinical Impression(s) / ED Diagnoses Final diagnoses:  Dizziness    Rx / DC Orders ED Discharge Orders          Ordered    Ambulatory referral to Neurology       Comments: An appointment is requested in approximately: 2 weeks   11/26/21 [place], [place], DO 11/26/21 1603

## 2021-11-26 NOTE — ED Notes (Signed)
Dizzines x 3 day.Denies blurred vision ,Denies n/v Denies unsteady gate.

## 2021-11-26 NOTE — ED Triage Notes (Signed)
Pt reports continued dizziness since Thurs; was seen at [facility] for same

## 2021-11-29 ENCOUNTER — Encounter: Payer: Self-pay | Admitting: Family

## 2021-11-29 ENCOUNTER — Ambulatory Visit (INDEPENDENT_AMBULATORY_CARE_PROVIDER_SITE_OTHER): Payer: Self-pay | Admitting: Family

## 2021-11-29 VITALS — BP 110/76 | HR 80 | Temp 97.7°F | Ht 60.0 in | Wt 164.6 lb

## 2021-11-29 DIAGNOSIS — R42 Dizziness and giddiness: Secondary | ICD-10-CM

## 2021-11-29 MED ORDER — FLUTICASONE PROPIONATE 50 MCG/ACT NA SUSP: 2.0000 | Freq: Every day | NASAL | 6 refills | Status: DC

## 2021-11-29 MED ORDER — CETIRIZINE HCL 10 MG PO TABS: 10.0000 mg | ORAL_TABLET | Freq: Every day | ORAL | 11 refills | Status: DC

## 2021-11-29 MED ORDER — AMOXICILLIN-POT CLAVULANATE 875-125 MG PO TABS: 1.0000 | ORAL_TABLET | Freq: Two times a day (BID) | ORAL | 0 refills | Status: AC

## 2021-11-29 NOTE — Progress Notes (Signed)
[name] is a 31 y.o. female with the following history as recorded in EpicCare:  Patient Active Problem List   Diagnosis Date Noted   Primary hypertension 05/16/2021   Anxiety and depression 05/16/2021   Acne rosacea 04/07/2013   Insomnia 04/07/2013   Epistaxis 08/28/2012   Palpitations 06/23/2011   Cardiomyopathy ([place]) 06/09/2010   Preop cardiovascular exam 06/07/2010   Nonspecific abnormal electrocardiogram (ECG) (EKG) 06/07/2010   Chest pain 06/07/2010    Current Outpatient Medications  Medication Sig Dispense Refill   amoxicillin-clavulanate (AUGMENTIN) 875-125 MG tablet Take 1 tablet by mouth 2 (two) times daily for 10 days. 20 tablet 0   cetirizine (ZYRTEC) 10 MG tablet Take 1 tablet (10 mg total) by mouth daily. 30 tablet 11   fluticasone (FLONASE) 50 MCG/ACT nasal spray Place 2 sprays into both nostrils daily. 16 g 6   No current facility-administered medications for this visit.    Allergies: Patient has no known allergies.  Past Medical History:  Diagnosis Date   Anxiety    Cardiomyopathy    Depression    Facial angiofibroma    Heart palpitations    Hypertension    Irregular heartbeat    Nosebleed     Past Surgical History:  Procedure Laterality Date   NASAL HEMORRHAGE CONTROL Bilateral 08/28/2012   Procedure: EPISTAXIS CONTROL;  Surgeon: Jerrell Belfast, MD;  Location: [facility] OR;  Service: ENT;  Laterality: Bilateral;    Family History  Problem Relation Age of Onset   Heart disease Father        Unknown type    Social History   Tobacco Use   Smoking status: Never   Smokeless tobacco: Never  Substance Use Topics   Alcohol use: No    Subjective:  Seen at ER on 10/27 and 10/28 with episodes of dizziness; exams both times were reassuring- neurology follow up outpatient was recommended; Of note, she did have COVID 1 week prior to onset of symptoms;  Difficult historian/ Vague description of symptoms per patient; she admits she has been eating more  junk food recently as this is the type of food she has at home;     Objective:  Vitals:   11/29/21 1032  BP: 110/76  Pulse: 80  Temp: 97.7 F (36.5 C)  TempSrc: Oral  SpO2: 99%  Weight: 164 lb 9.6 oz (74.7 kg)  Height: 5' (1.524 m)    General: Well developed, well nourished, in no acute distress  Skin : Warm and dry.  Head: Normocephalic and atraumatic  Eyes: Sclera and conjunctiva clear; pupils round and reactive to light; extraocular movements intact  Ears: External normal; canals clear; tympanic membranes normal  Oropharynx: Pink, supple. No suspicious lesions; post nasal drainage noted Neck: Supple without thyromegaly, adenopathy  Lungs: Respirations unlabored; clear to auscultation bilaterally without wheeze, rales, rhonchi  CVS exam: normal rate and regular rhythm.  Neurologic: Alert and oriented; speech intact; face symmetrical; moves all extremities well; CNII-XII intact without focal deficit   Assessment:  1. Dizziness     Plan:  ? Sinus infection due to recent COVID infection- extensive PND is seen; Rx for Augmentin, Zyrtec and Flonase; increase fluids, rest; Will update referral to neurology; Patient's weight is down compared to OV in July- she admits that she is walking a lot at her job and feels this is why; continue to monitor- keep planned follow up with her PCP for 2 weeks;   No follow-ups on file.  Orders Placed This Encounter  Procedures  Ambulatory referral to Neurology    Referral Priority:   Routine    Referral Type:   Consultation    Referral Reason:   Specialty Services Required    Requested Specialty:   Neurology    Number of Visits Requested:   1    Requested Prescriptions   Signed Prescriptions Disp Refills   fluticasone (FLONASE) 50 MCG/ACT nasal spray 16 g 6    Sig: Place 2 sprays into both nostrils daily.   cetirizine (ZYRTEC) 10 MG tablet 30 tablet 11    Sig: Take 1 tablet (10 mg total) by mouth daily.   amoxicillin-clavulanate  (AUGMENTIN) 875-125 MG tablet 20 tablet 0    Sig: Take 1 tablet by mouth 2 (two) times daily for 10 days.

## 2021-12-02 ENCOUNTER — Ambulatory Visit: Payer: Self-pay | Admitting: Family

## 2021-12-05 ENCOUNTER — Ambulatory Visit: Payer: Self-pay | Admitting: Family Medicine

## 2021-12-16 ENCOUNTER — Telehealth: Payer: Self-pay | Admitting: *Deleted

## 2021-12-16 ENCOUNTER — Ambulatory Visit: Payer: Self-pay | Admitting: Family Medicine

## 2021-12-16 NOTE — Telephone Encounter (Signed)
Letter sent to pt notifying her of 2 no shows.

## 2021-12-16 NOTE — Progress Notes (Deleted)
   Established Patient Office Visit  Subjective   Patient ID: [name], female    DOB: 04/14/1990  Age: 31 y.o. MRN: [id number]  No chief complaint on file.   HPI  Patient is here for her 52-monthfollow-up, chronic disease management.  HYPERTENSION: - Medications: lifestyle measures only (no meds since May 2023 ,previously on HCTZ but started getting hypotensive) - Compliance: n/a - Checking BP at home: *** - Denies any SOB, recurrent headaches, CP, vision changes, LE edema, dizziness, palpitations, or medication side effects. - Diet: *** - Exercise: *** - Stressors:  ANXIETY/DEPRESSION: - Mood has been *** - No SI/HI - She has been off of Celexa since spring of 2023          {History (Optional):23778}  ROS    Objective:     LMP 11/23/2021  {Vitals History (Optional):23777}  Physical Exam   No results found for any visits on 12/16/21.  {Labs (Optional):23779}  The ASCVD Risk score (Arnett DK, et al., 2019) failed to calculate for the following reasons:   The 2019 ASCVD risk score is only valid for ages 462to 745   Assessment & Plan:   Problem List Items Addressed This Visit   None   No follow-ups on file.    TTerrilyn Saver NP

## 2021-12-27 ENCOUNTER — Encounter: Payer: Self-pay | Admitting: Neurology

## 2021-12-27 ENCOUNTER — Ambulatory Visit: Payer: Self-pay | Admitting: Neurology

## 2021-12-27 ENCOUNTER — Encounter (HOSPITAL_BASED_OUTPATIENT_CLINIC_OR_DEPARTMENT_OTHER): Payer: Self-pay

## 2021-12-27 ENCOUNTER — Other Ambulatory Visit: Payer: Self-pay

## 2021-12-27 ENCOUNTER — Emergency Department (HOSPITAL_BASED_OUTPATIENT_CLINIC_OR_DEPARTMENT_OTHER)
Admission: EM | Admit: 2021-12-27 | Discharge: 2021-12-28 | Disposition: A | Discharge status: Home / Self Care | Payer: Self-pay | Attending: Emergency Medicine | Admitting: Emergency Medicine

## 2021-12-27 DIAGNOSIS — E876 Hypokalemia: Secondary | ICD-10-CM | POA: Insufficient documentation

## 2021-12-27 DIAGNOSIS — R197 Diarrhea, unspecified: Secondary | ICD-10-CM | POA: Insufficient documentation

## 2021-12-27 LAB — URINALYSIS, MICROSCOPIC (REFLEX)

## 2021-12-27 LAB — CBC
HCT: 45.5 % (ref 36.0–46.0)
Hemoglobin: 14.8 g/dL (ref 12.0–15.0)
MCH: 26.7 pg (ref 26.0–34.0)
MCHC: 32.5 g/dL (ref 30.0–36.0)
MCV: 82.1 fL (ref 80.0–100.0)
Platelets: 262 10*3/uL (ref 150–400)
RBC: 5.54 MIL/uL — ABNORMAL HIGH (ref 3.87–5.11)
RDW: 14.6 % (ref 11.5–15.5)
WBC: 8.6 10*3/uL (ref 4.0–10.5)
nRBC: 0 % (ref 0.0–0.2)

## 2021-12-27 LAB — URINALYSIS, ROUTINE W REFLEX MICROSCOPIC
Bilirubin Urine: NEGATIVE
Glucose, UA: NEGATIVE mg/dL
Ketones, ur: NEGATIVE mg/dL
Leukocytes,Ua: NEGATIVE
Nitrite: NEGATIVE
Protein, ur: NEGATIVE mg/dL
Specific Gravity, Urine: >=1.03 (ref 1.005–1.030)
pH: 6 (ref 5.0–8.0)

## 2021-12-27 LAB — COMPREHENSIVE METABOLIC PANEL
ALT: 22 U/L (ref 0–44)
AST: 19 U/L (ref 15–41)
Albumin: 3.9 g/dL (ref 3.5–5.0)
Alkaline Phosphatase: 103 U/L (ref 38–126)
Anion gap: 8 (ref 5–15)
BUN: 17 mg/dL (ref 6–20)
CO2: 23 mmol/L (ref 22–32)
Calcium: 9.1 mg/dL (ref 8.9–10.3)
Chloride: 104 mmol/L (ref 98–111)
Creatinine, Ser: 0.68 mg/dL (ref 0.44–1.00)
GFR, Estimated: >60 mL/min (ref >60)
Glucose, Bld: 96 mg/dL (ref 70–99)
Potassium: 3.4 mmol/L — ABNORMAL LOW (ref 3.5–5.1)
Sodium: 135 mmol/L (ref 135–145)
Total Bilirubin: 0.4 mg/dL (ref 0.3–1.2)
Total Protein: 7.8 g/dL (ref 6.5–8.1)

## 2021-12-27 LAB — LIPASE, BLOOD: Lipase: 26 U/L (ref 11–51)

## 2021-12-27 LAB — PREGNANCY, URINE: Preg Test, Ur: NEGATIVE

## 2021-12-27 MED ORDER — POTASSIUM CHLORIDE CRYS ER 20 MEQ PO TBCR
20.0000 meq | EXTENDED_RELEASE_TABLET | Freq: Once | ORAL | Status: AC
  Administered 2021-12-28: 20 meq via ORAL
  Filled 2021-12-27: qty 1

## 2021-12-27 NOTE — ED Triage Notes (Addendum)
Pt with 1 day of watery stools. Pt reports yellow color, but it is mostly water. Pt trying to stay hydrated, but reports that drinking makes her pass more stool. Pt states she ate eggs earlier; denies fever, N/V. X6 stools today.

## 2021-12-27 NOTE — ED Provider Notes (Signed)
  [name] Provider Note   CSN: [id number] Arrival date & time: 12/27/21  2034     History {Add pertinent medical, surgical, social history, OB history to HPI:1} Chief Complaint  Patient presents with   Diarrhea    [name] is a 31 y.o. female.  The history is provided by the patient.  Diarrhea [name] is a 31 y.o. female who presents to the Emergency [facility] complaining of *** Watery diarrhea, today  7 episodes No fever Non black or bloody No abdominal pain No known sick contacts Started after eating eggs No n/v  No recent abx.       Home Medications Prior to Admission medications   Medication Sig Start Date End Date Taking? Authorizing Provider  cetirizine (ZYRTEC) 10 MG tablet Take 1 tablet (10 mg total) by mouth daily. 11/29/21   Marrian Salvage, FNP  fluticasone (FLONASE) 50 MCG/ACT nasal spray Place 2 sprays into both nostrils daily. 11/29/21   Marrian Salvage, [place]      Allergies    Patient has no known allergies.    Review of Systems   Review of Systems  Gastrointestinal:  Positive for diarrhea.  All other systems reviewed and are negative.   Physical Exam Updated Vital Signs BP 112/87 (BP Location: Left Arm)   Pulse 90   Temp 98.2 F (36.8 C)   Resp 18   Ht 5' (1.524 m)   Wt 74.4 kg   SpO2 97%   BMI 32.03 kg/m  Physical Exam  ED Results / Procedures / Treatments   Labs (all labs ordered are listed, but only abnormal results are displayed) Labs Reviewed  COMPREHENSIVE METABOLIC PANEL - Abnormal; Notable for the following components:      Result Value   Potassium 3.4 (*)    All other components within normal limits  CBC - Abnormal; Notable for the following components:   RBC 5.54 (*)    All other components within normal limits  LIPASE, BLOOD  URINALYSIS, ROUTINE W REFLEX MICROSCOPIC  PREGNANCY, URINE    EKG None  Radiology No results  found.  Procedures Procedures  {Document cardiac monitor, telemetry assessment procedure when appropriate:1}  Medications Ordered in ED Medications - No data to display  ED Course/ Medical Decision Making/ A&P                           Medical Decision Making Amount and/or Complexity of Data Reviewed Labs: ordered.   ***  {Document critical care time when appropriate:1} {Document review of labs and clinical decision tools ie heart score, Chads2Vasc2 etc:1}  {Document your independent review of radiology images, and any outside records:1} {Document your discussion with family members, caretakers, and with consultants:1} {Document social determinants of health affecting pt's care:1} {Document your decision making why or why not admission, treatments were needed:1} Final Clinical Impression(s) / ED Diagnoses Final diagnoses:  None    Rx / DC Orders ED Discharge Orders     None

## 2021-12-28 LAB — GASTROINTESTINAL PANEL BY PCR, STOOL (REPLACES STOOL CULTURE)
Adenovirus F40/41: NOT DETECTED
Astrovirus: NOT DETECTED
Campylobacter species: NOT DETECTED
Cryptosporidium: NOT DETECTED
Cyclospora cayetanensis: NOT DETECTED
Entamoeba histolytica: NOT DETECTED
Enteroaggregative E coli (EAEC): NOT DETECTED
Enteropathogenic E coli (EPEC): NOT DETECTED
Enterotoxigenic E coli (ETEC): NOT DETECTED
Giardia lamblia: NOT DETECTED
Norovirus GI/GII: NOT DETECTED
Plesimonas shigelloides: NOT DETECTED
Rotavirus A: DETECTED — AB
Salmonella species: NOT DETECTED
Sapovirus (I, II, IV, and V): DETECTED — AB
Shiga like toxin producing E coli (STEC): NOT DETECTED
Shigella/Enteroinvasive E coli (EIEC): NOT DETECTED
Vibrio cholerae: NOT DETECTED
Vibrio species: NOT DETECTED
Yersinia enterocolitica: NOT DETECTED

## 2021-12-28 LAB — C DIFFICILE QUICK SCREEN W PCR REFLEX
C Diff antigen: NEGATIVE
C Diff interpretation: NOT DETECTED
C Diff toxin: NEGATIVE

## 2021-12-29 ENCOUNTER — Telehealth: Payer: Self-pay

## 2021-12-29 NOTE — Telephone Encounter (Deleted)
Transition Care Management Follow-up Telephone Call Date of discharge and from where: [facility] ED 12/28/2021 How have you been since you were released from the hospital? better Any questions or concerns? No  Items Reviewed: Did the pt receive and understand the discharge instructions provided? Yes  Medications obtained and verified? Yes  Other? No  Any new allergies since your discharge? No  Dietary orders reviewed? Yes Do you have support at home? Yes   Home Care and Equipment/Supplies: Were home health services ordered? no If so, what is the name of the agency? N/a  Has the agency set up a time to come to the patient's home? not applicable Were any new equipment or medical supplies ordered?  No What is the name of the medical supply agency? N/a Were you able to get the supplies/equipment? no Do you have any questions related to the use of the equipment or supplies? No  Functional Questionnaire: (I = Independent and D = Dependent) ADLs: I  Bathing/Dressing- I  Meal Prep- I  Eating- I  Maintaining continence- I  Transferring/Ambulation- I  Managing Meds- I  Follow up appointments reviewed:  PCP Hospital f/u appt confirmed? {YES/NO:21197} Scheduled to see *** on *** @ ***. [facility] f/u appt confirmed? {YES/NO:21197} Scheduled to see *** on *** @ ***. Are transportation arrangements needed? {YES/NO:21197} If their condition worsens, is the pt aware to call PCP or go to the Emergency Dept.? {YES/NO TITLE CASE:22902} Was the patient provided with contact information for the PCP's office or ED? {YES/NO TITLE CASE:22902} Was to pt encouraged to call back with questions or concerns? {YES/NO TITLE CASE:22902}

## 2021-12-29 NOTE — Telephone Encounter (Signed)
Transition Care Management Follow-up Telephone Call Date of discharge and from where: [facility] 12/28/2021 How have you been since you were released from the hospital? better Any questions or concerns? No  Items Reviewed: Did the pt receive and understand the discharge instructions provided? Yes  Medications obtained and verified? Yes  Other? No  Any new allergies since your discharge? No  Dietary orders reviewed? Yes Do you have support at home? Yes   Home Care and Equipment/Supplies: Were home health services ordered? no If so, what is the name of the agency? N/a  Has the agency set up a time to come to the patient's home? no Were any new equipment or medical supplies ordered?  No What is the name of the medical supply agency? N/a Were you able to get the supplies/equipment? no Do you have any questions related to the use of the equipment or supplies? No  Functional Questionnaire: (I = Independent and D = Dependent) ADLs: I  Bathing/Dressing- I  Meal Prep- I  Eating- I  Maintaining continence- I  Transferring/Ambulation- I  Managing Meds- I  Follow up appointments reviewed:  PCP Hospital f/u appt confirmed? Yes Caleen Jobs Scheduled to see  on 01/02/2022 @ 11:20. [facility] f/u appt confirmed? No   Are transportation arrangements needed? No  If their condition worsens, is the pt aware to call PCP or go to the Emergency Dept.? Yes Was the patient provided with contact information for the PCP's office or ED? Yes Was to pt encouraged to call back with questions or concerns? Yes Juanda Crumble, LPN [facility] [phone]

## 2022-01-02 ENCOUNTER — Inpatient Hospital Stay: Payer: Self-pay | Admitting: Family Medicine

## 2022-03-13 ENCOUNTER — Ambulatory Visit (HOSPITAL_COMMUNITY): Payer: Self-pay | Admitting: Clinical

## 2022-03-16 ENCOUNTER — Telehealth: Payer: Self-pay | Admitting: Family Medicine

## 2022-03-16 NOTE — Telephone Encounter (Signed)
Pt said she has a runny nose, sore throat, mucus in throat and wants to know if she is able to take mucinex. Please call pt back to advise.

## 2022-03-16 NOTE — Telephone Encounter (Signed)
Pt aware.

## 2022-03-21 ENCOUNTER — Other Ambulatory Visit: Payer: Self-pay

## 2022-03-21 ENCOUNTER — Emergency Department (HOSPITAL_BASED_OUTPATIENT_CLINIC_OR_DEPARTMENT_OTHER)
Admission: EM | Admit: 2022-03-21 | Discharge: 2022-03-21 | Disposition: A | Discharge status: Home / Self Care | Payer: Self-pay | Attending: Emergency Medicine | Admitting: Emergency Medicine

## 2022-03-21 ENCOUNTER — Encounter (HOSPITAL_BASED_OUTPATIENT_CLINIC_OR_DEPARTMENT_OTHER): Payer: Self-pay

## 2022-03-21 DIAGNOSIS — F32A Depression, unspecified: Secondary | ICD-10-CM | POA: Insufficient documentation

## 2022-03-21 DIAGNOSIS — G44209 Tension-type headache, unspecified, not intractable: Secondary | ICD-10-CM | POA: Insufficient documentation

## 2022-03-21 MED ORDER — SODIUM CHLORIDE 0.9 % IV BOLUS: 1000.0000 mL | Freq: Once | INTRAVENOUS | Status: DC

## 2022-03-21 MED ORDER — DIPHENHYDRAMINE HCL 50 MG/ML IJ SOLN
12.5000 mg | Freq: Once | INTRAMUSCULAR | Status: DC
  Filled 2022-03-21: qty 1

## 2022-03-21 MED ORDER — ACETAMINOPHEN 500 MG PO TABS
1000.0000 mg | ORAL_TABLET | Freq: Once | ORAL | Status: DC
  Filled 2022-03-21: qty 2

## 2022-03-21 MED ORDER — SODIUM CHLORIDE 0.9 % IV SOLN: INTRAVENOUS | Status: DC

## 2022-03-21 MED ORDER — METOCLOPRAMIDE HCL 5 MG/ML IJ SOLN
10.0000 mg | Freq: Once | INTRAMUSCULAR | Status: DC
  Filled 2022-03-21: qty 2

## 2022-03-21 NOTE — ED Provider Notes (Signed)
[facility] Provider Note   CSN: [id number] Arrival date & time: 03/21/22  1339     History  Chief Complaint  Patient presents with   Headache   Depression    [name] is a 32 y.o. female.   Headache Depression Associated symptoms include headaches.     32 year old female presenting to the emergency department with headaches and intermittent symptoms of depression.  The patient states that she currently is without headache today.  She had a headache yesterday.  She did not take any medications for this.  She denies any fevers, vision changes, facial droop, numbness or weakness focally.  Symptoms resolved without any intervention.  She also states that she has been having increasing symptoms of depression for the past 2 months and feels like she has been isolating herself more.  She denies any suicidal ideation, homicidal ideation, audiovisual hallucinations.  She endorses increasing feelings of worthlessness and difficulty with mood stabilization stating that she has frequent outbursts of emotions that she cannot control.  She states that she has not followed up with a primary care provider regarding her symptoms due to financial concerns.  Home Medications Prior to Admission medications   Medication Sig Start Date End Date Taking? Authorizing Provider  cetirizine (ZYRTEC) 10 MG tablet Take 1 tablet (10 mg total) by mouth daily. 11/29/21   Marrian Salvage, FNP  fluticasone (FLONASE) 50 MCG/ACT nasal spray Place 2 sprays into both nostrils daily. 11/29/21   Marrian Salvage, [place]      Allergies    Patient has no known allergies.    Review of Systems   Review of Systems  Neurological:  Positive for headaches.  Psychiatric/Behavioral:  Positive for depression and dysphoric mood.   All other systems reviewed and are negative.   Physical Exam Updated Vital Signs BP 128/85 (BP Location: Left Arm)   Pulse 87    Temp 98.6 F (37 C)   Resp 18   Ht 5' (1.524 m)   Wt 77.9 kg   SpO2 100%   BMI 33.55 kg/m  Physical Exam Vitals and nursing note reviewed.  Constitutional:      General: She is not in acute distress.    Appearance: She is well-developed.  HENT:     Head: Normocephalic and atraumatic.  Eyes:     Conjunctiva/sclera: Conjunctivae normal.  Cardiovascular:     Rate and Rhythm: Normal rate and regular rhythm.     Heart sounds: No murmur heard. Pulmonary:     Effort: Pulmonary effort is normal. No respiratory distress.     Breath sounds: Normal breath sounds.  Abdominal:     Palpations: Abdomen is soft.     Tenderness: There is no abdominal tenderness.  Musculoskeletal:        General: No swelling.     Cervical back: Neck supple.  Skin:    General: Skin is warm and dry.     Capillary Refill: Capillary refill takes less than 2 seconds.  Neurological:     Mental Status: She is alert.     Comments: MENTAL STATUS EXAM:    Orientation: Alert and oriented to person, place and time.  Memory: Cooperative, follows commands well.  Language: Speech is clear and language is normal.   CRANIAL NERVES:    CN 2 (Optic): Visual fields intact to confrontation.  CN 3,4,6 (EOM): Pupils equal and reactive to light. Full extraocular eye movement without nystagmus.  CN 5 (Trigeminal): Facial  sensation is normal, no weakness of masticatory muscles.  CN 7 (Facial): No facial weakness or asymmetry.  CN 8 (Auditory): Auditory acuity grossly normal.  CN 9,10 (Glossophar): The uvula is midline, the palate elevates symmetrically.  CN 11 (spinal access): Normal sternocleidomastoid and trapezius strength.  CN 12 (Hypoglossal): The tongue is midline. No atrophy or fasciculations.Marland Kitchen   MOTOR:  Muscle Strength: 5/5RUE, 5/5LUE, 5/5RLE, 5/5LLE.   COORDINATION:   No tremor.   SENSATION:   Intact to light touch all four extremities.  GAIT: Gait normal without ataxia   Psychiatric:        Attention and  Perception: Attention and perception normal.        Mood and Affect: Mood and affect normal.        Speech: Speech normal.        Behavior: Behavior normal. Behavior is cooperative.        Thought Content: Thought content normal. Thought content does not include homicidal or suicidal ideation.     ED Results / Procedures / Treatments   Labs (all labs ordered are listed, but only abnormal results are displayed) Labs Reviewed - No data to display  EKG None  Radiology No results found.  Procedures Procedures    Medications Ordered in ED Medications - No data to display  ED Course/ Medical Decision Making/ A&P                             Medical Decision Making   32 year old female presenting to the emergency department with headaches and intermittent symptoms of depression.  The patient states that she currently is without headache today.  She had a headache yesterday.  She did not take any medications for this.  She denies any fevers, vision changes, facial droop, numbness or weakness focally.  Symptoms resolved without any intervention.  She also states that she has been having increasing symptoms of depression for the past 2 months and feels like she has been isolating herself more.  She denies any suicidal ideation, homicidal ideation, audiovisual hallucinations.  She endorses increasing feelings of worthlessness and difficulty with mood stabilization stating that she has frequent outbursts of emotions that she cannot control.  She states that she has not followed up with a primary care provider regarding her symptoms due to financial concerns.  On arrival, the patient was vitally stable.  Currently she is alert, GCS 15, hemodynamically stable and afebrile.  Her exam is significant for a normal neurologic exam.  I am most concerned for tension type headache.   I do not think the patient has an aneurysm, intracranial bleed, mass lesion, meningitis, temporal arteritis, stroke,  cluster headache, idiopathic intracranial hypertension, cavernous sinus thrombosis, carbon monoxide toxicity, herpes zoster, carotid or vertebral artery dissection, or acute angle close glaucoma.  Regarding the patient's symptoms of depression, I recommend she follow-up with a primary care physician to discuss mood stabilizing medication versus antidepressant medication.  No emergent indication for inpatient psychiatric hospitalization or consultation at this time.  We have discussed the diagnosis and risks, and we agree with discharging home to follow-up with their primary doctor. We also discussed returning to the Emergency Department immediately if new or worsening symptoms occur. We have discussed the symptoms which are most concerning (e.g., changing or worsening pain, weakness, vomiting, fever, or abnormal sensation) that necessitate immediate return. I provided ED return precautions.  Final Clinical Impression(s) / ED Diagnoses Final diagnoses:  Acute non  intractable tension-type headache  Depression, unspecified depression type    Rx / DC Orders ED Discharge Orders     None         Regan Lemming, MD 03/21/22 [phone]

## 2022-03-21 NOTE — ED Notes (Signed)
ED Provider at bedside. 

## 2022-03-21 NOTE — ED Notes (Signed)
Pt understood and verbally acknowledged discharge instructions

## 2022-03-21 NOTE — ED Triage Notes (Signed)
Pt c/o headache intermittently on right side that radiates to her jaw that started a week ago.  Pt c/o depression for 2 months and she feels like she is isolating herself more. Pt denies SI/HI.

## 2022-03-21 NOTE — Discharge Instructions (Addendum)
Please follow-up with your primary care physician regarding your symptoms.  Recommend Tylenol and ibuprofen for development of any further headaches.  Resources have been provided for outpatient counseling.  Primary care physician can determine whether you need to be on a mood stabilizing medication or antidepressant.

## 2022-03-23 ENCOUNTER — Encounter: Payer: Self-pay | Admitting: Family Medicine

## 2022-03-23 ENCOUNTER — Ambulatory Visit (INDEPENDENT_AMBULATORY_CARE_PROVIDER_SITE_OTHER): Payer: Self-pay | Admitting: Family Medicine

## 2022-03-23 VITALS — BP 118/87 | HR 90 | Temp 98.2°F | Resp 16 | Ht 60.0 in | Wt 172.2 lb

## 2022-03-23 DIAGNOSIS — F32A Depression, unspecified: Secondary | ICD-10-CM

## 2022-03-23 DIAGNOSIS — F419 Anxiety disorder, unspecified: Secondary | ICD-10-CM

## 2022-03-23 MED ORDER — VENLAFAXINE HCL ER 37.5 MG PO CP24: 37.5000 mg | ORAL_CAPSULE | Freq: Every day | ORAL | 0 refills | Status: DC

## 2022-03-23 NOTE — Progress Notes (Signed)
Acute Office Visit  Subjective:     Patient ID: [name], female    DOB: [birth year], 32 y.o.   MRN: [id number]  Chief Complaint  Patient presents with   Depression    Here for Depression     Patient is in today for anxiety and depression.  Patient is here today to discuss anxiety and depression.  Reports she has struggled with this in the past and has tried to help Zoloft and Celexa, but folic she was doing well without medications for several months.  However over the past month or so life has become very difficult for her.  States her uncle died in 02/08/2023, her mom has had some health issues, she recently lost her apartment, and she has had some relationship issues with her boyfriend.  Reports that she has no energy, she just goes to work and then goes to bed.  She has started vaping and eating more comfort foods.  States she has no control of her emotions, has trouble focusing, feels like she is not wanted by anyone.  Reports her support system is very small.  She denies any SI/HI.  States she is just having a hard time coping with everything going on.  She denies any panic attacks.  She is interested in counseling and restarting medication.     03/23/2022    4:03 PM 11/29/2021   10:34 AM 07/06/2021   11:05 AM  PHQ9 SCORE ONLY  PHQ-9 Total Score 22 0 1      03/23/2022    4:02 PM 07/06/2021   11:05 AM 12/22/2019    2:01 PM 06/09/2016   11:00 AM  GAD 7 : Generalized Anxiety Score  Nervous, Anxious, on Edge 2 1 2 2  $ Control/stop worrying 3 0 3 2  Worry too much - different things 3 1 3 3  $ Trouble relaxing 3 0 3 3  Restless 1 0 3 3  Easily annoyed or irritable 3 0 3 3  Afraid - awful might happen 3 1 3 3  $ Total GAD 7 Score 18 3 20 19  $ Anxiety Difficulty Very difficult Somewhat difficult Somewhat difficult      All review of systems negative except what is listed in the HPI      Objective:    BP 118/87 (BP Location: Right Arm, Patient Position: Sitting, Cuff  Size: Normal)   Pulse 90   Temp 98.2 F (36.8 C) (Oral)   Resp 16   Ht 5' (1.524 m)   Wt 172 lb 3.2 oz (78.1 kg)   SpO2 100%   BMI 33.63 kg/m    Physical Exam Vitals reviewed.  Constitutional:      Appearance: Normal appearance.  Cardiovascular:     Rate and Rhythm: Normal rate and regular rhythm.  Pulmonary:     Effort: Pulmonary effort is normal.     Breath sounds: Normal breath sounds.  Skin:    General: Skin is warm and dry.  Neurological:     Mental Status: She is alert and oriented to person, place, and time.  Psychiatric:        Thought Content: Thought content normal.     Comments: tearful     No results found for any visits on 03/23/22.      Assessment & Plan:   Problem List Items Addressed This Visit       Other   Anxiety and depression - Primary (Chronic)   Relevant Medications   venlafaxine XR (EFFEXOR-XR) 37.5  MG 24 hr capsule   Other Relevant Orders   Ambulatory referral to [facility]   Discussed options with patient.  Starting Effexor.  Good Rx coupon and printed prescription provided.  Medication education discussed and handout provided.  Referral for counseling.  Information given regarding behavioral health urgent care center.  No SI/HI. Patient aware of signs/symptoms requiring further/urgent evaluation.    4-6 week mood follow-up or sooner if needed.     Meds ordered this encounter  Medications   venlafaxine XR (EFFEXOR-XR) 37.5 MG 24 hr capsule    Sig: Take 1 capsule (37.5 mg total) by mouth daily with breakfast.    Dispense:  60 capsule    Refill:  0    Order Specific Question:   Supervising Provider    Answer:   Penni Homans A [[id number]]    Return in about 4 weeks (around 04/20/2022) for mood f/u.  Terrilyn Saver, NP

## 2022-03-23 NOTE — Patient Instructions (Addendum)
Taking the medicine as directed and not missing any doses is one of the best things you can do to treat your anxiety/depression.  Here are some things to keep in mind: Side effects (stomach upset, some increased anxiety) may happen before you notice a benefit.  These side effects typically go away over time. Changes to your dose of medicine or a change in medication all together is sometimes necessary Many people will notice an improvement within two weeks but the full effect of the medication can take up to 4-6 weeks Stopping the medication when you start feeling better often results in a return of symptoms. Most people need to be on medication at least 6-12 months If you start having thoughts of hurting yourself or others after starting this medicine, please call me immediately.    [facility] - Urgent care services - Outpatient services: Individual Therapy Partial Hospitalization Program (PHP) Substance Abuse Intensive Outpatient Program [facility]) Specialized Intensive Adult Group Therapy Medication Management Peer Living Room  Phone: [phone]  Address: [place], [place] [address]  Hours: Open 24/7, No appointment required.

## 2022-04-20 ENCOUNTER — Ambulatory Visit: Payer: Self-pay | Admitting: Family Medicine

## 2022-05-03 ENCOUNTER — Ambulatory Visit: Payer: Self-pay | Admitting: Family Medicine

## 2022-05-19 ENCOUNTER — Encounter (HOSPITAL_BASED_OUTPATIENT_CLINIC_OR_DEPARTMENT_OTHER): Payer: Self-pay | Admitting: Emergency Medicine

## 2022-05-19 ENCOUNTER — Other Ambulatory Visit: Payer: Self-pay

## 2022-05-19 ENCOUNTER — Emergency Department (HOSPITAL_BASED_OUTPATIENT_CLINIC_OR_DEPARTMENT_OTHER)
Admission: EM | Admit: 2022-05-19 | Discharge: 2022-05-19 | Discharge status: Against Medical Advice | Payer: Self-pay | Attending: Emergency Medicine | Admitting: Emergency Medicine

## 2022-05-19 ENCOUNTER — Emergency Department (HOSPITAL_BASED_OUTPATIENT_CLINIC_OR_DEPARTMENT_OTHER): Payer: Self-pay

## 2022-05-19 DIAGNOSIS — F419 Anxiety disorder, unspecified: Secondary | ICD-10-CM | POA: Insufficient documentation

## 2022-05-19 DIAGNOSIS — Z5321 Procedure and treatment not carried out due to patient leaving prior to being seen by health care provider: Secondary | ICD-10-CM | POA: Insufficient documentation

## 2022-05-19 DIAGNOSIS — R0602 Shortness of breath: Secondary | ICD-10-CM | POA: Insufficient documentation

## 2022-05-19 DIAGNOSIS — R079 Chest pain, unspecified: Secondary | ICD-10-CM | POA: Insufficient documentation

## 2022-05-19 DIAGNOSIS — R000 Tachycardia, unspecified: Secondary | ICD-10-CM | POA: Insufficient documentation

## 2022-05-19 LAB — BASIC METABOLIC PANEL
Anion gap: 9 (ref 5–15)
BUN: 16 mg/dL (ref 6–20)
CO2: 24 mmol/L (ref 22–32)
Calcium: 8.9 mg/dL (ref 8.9–10.3)
Chloride: 101 mmol/L (ref 98–111)
Creatinine, Ser: 0.69 mg/dL (ref 0.44–1.00)
GFR, Estimated: >60 mL/min (ref >60)
Glucose, Bld: 105 mg/dL — ABNORMAL HIGH (ref 70–99)
Potassium: 3.4 mmol/L — ABNORMAL LOW (ref 3.5–5.1)
Sodium: 134 mmol/L — ABNORMAL LOW (ref 135–145)

## 2022-05-19 LAB — CBC
HCT: 41.8 % (ref 36.0–46.0)
Hemoglobin: 13.4 g/dL (ref 12.0–15.0)
MCH: 26.7 pg (ref 26.0–34.0)
MCHC: 32.1 g/dL (ref 30.0–36.0)
MCV: 83.4 fL (ref 80.0–100.0)
Platelets: 269 10*3/uL (ref 150–400)
RBC: 5.01 MIL/uL (ref 3.87–5.11)
RDW: 14.2 % (ref 11.5–15.5)
WBC: 14 10*3/uL — ABNORMAL HIGH (ref 4.0–10.5)
nRBC: 0 % (ref 0.0–0.2)

## 2022-05-19 LAB — TROPONIN I (HIGH SENSITIVITY): Troponin I (High Sensitivity): 2 ng/L (ref <18)

## 2022-05-19 NOTE — ED Triage Notes (Signed)
Pt states she may have had an anxiety attack that started one hour ago.  Pt c/o elevated heart rate and sob.  No acute respiratory distress noted.  Pt feels like her heart rate is elevated.  Some mild chest pain upon onset of anxiety.

## 2022-05-25 IMAGING — US US ART/VEN ABD/PELV/SCROTUM DOPPLER LTD
1 series · 13 of 25 positions shown · non-contrast
Comparison: None.

CLINICAL DATA: Lower abdominal pain. Unsure of last menstrual
period.

EXAM:
TRANSABDOMINAL AND TRANSVAGINAL ULTRASOUND OF PELVIS
DOPPLER ULTRASOUND OF OVARIES
TECHNIQUE: Both transabdominal and transvaginal ultrasound examinations of the
pelvis were performed. Transabdominal technique was performed for
global imaging of the pelvis including uterus, ovaries, adnexal
regions, and pelvic cul-de-sac.
It was necessary to proceed with endovaginal exam following the
transabdominal exam to visualize the endometrium and bilateral
ovaries. Color and duplex Doppler ultrasound was utilized to
evaluate blood flow to the ovaries.

[Series 1: us art/ven abd/pelv/scrotum doppler ltd · 13 of 115 slices shown]
[im 1/115]
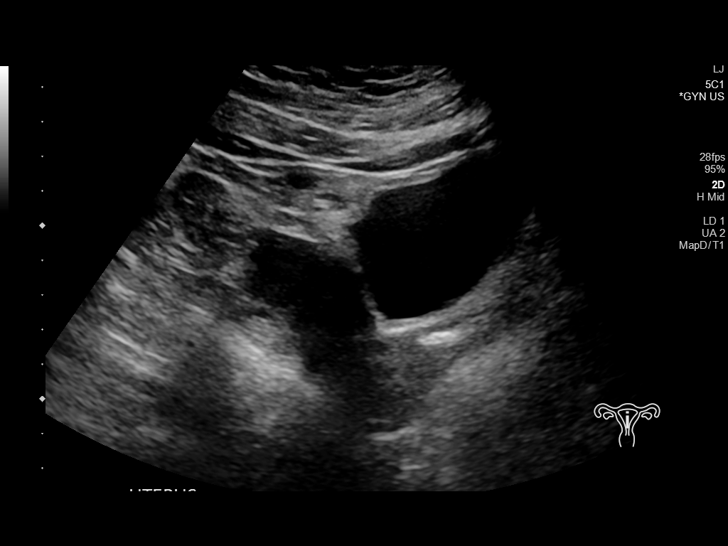
[im 10/115]
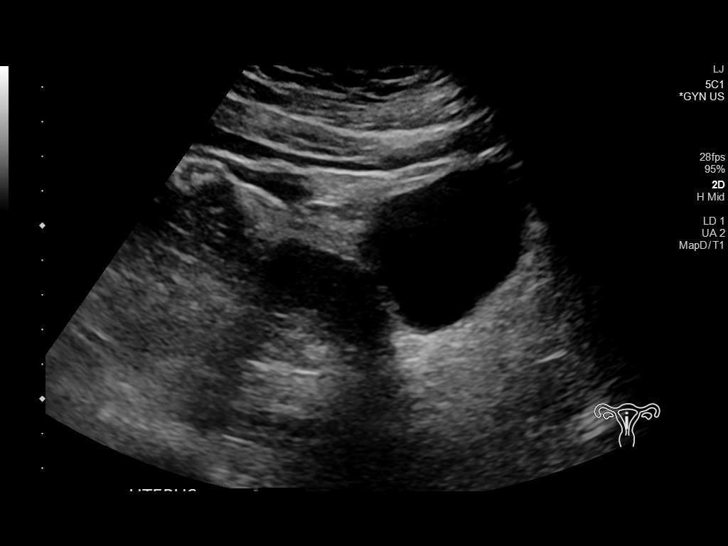
[im 20/115]
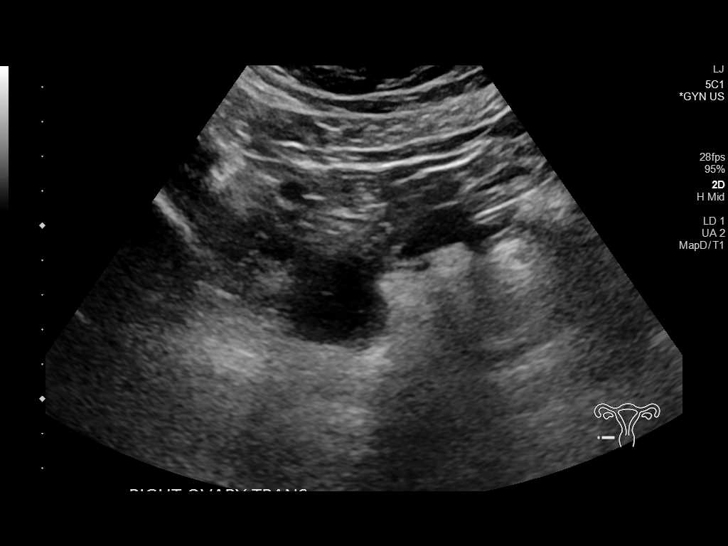
[im 29/115]
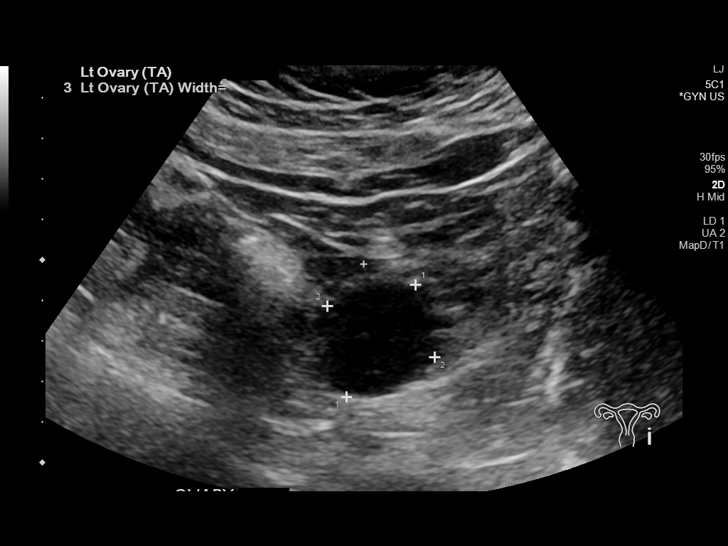
[im 39/115]
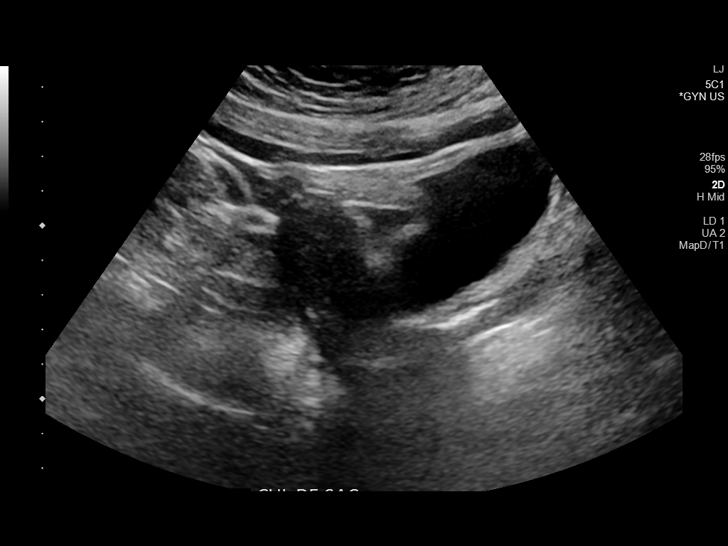
[im 48/115]
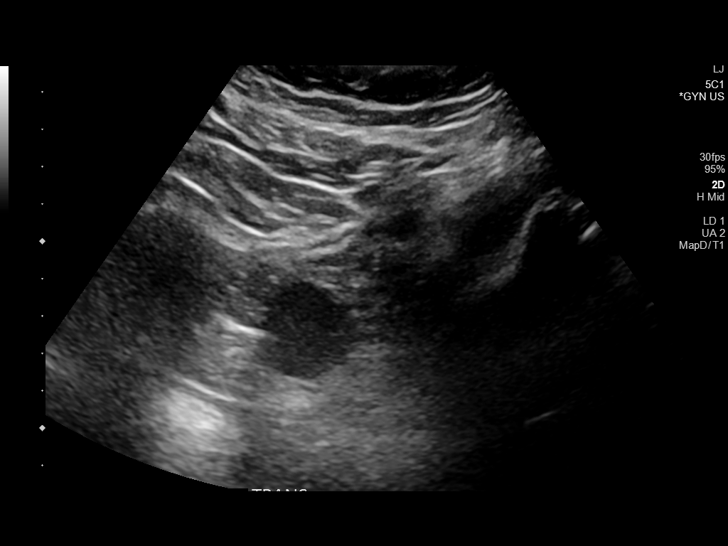
[im 58/115]
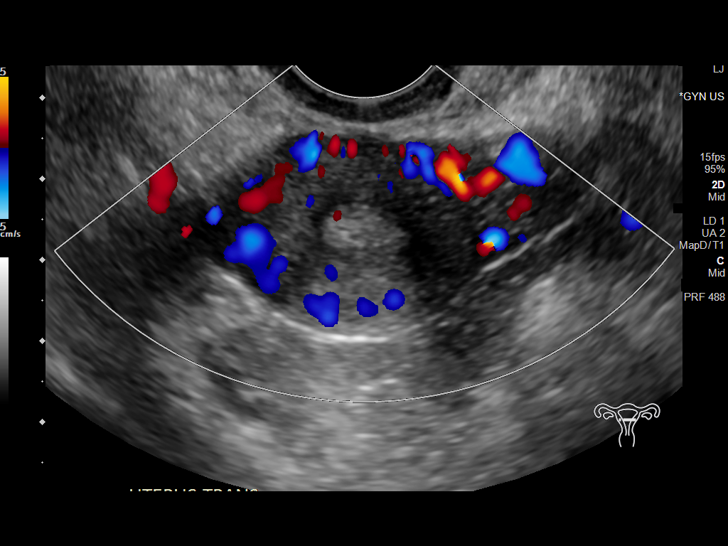
[im 67/115]
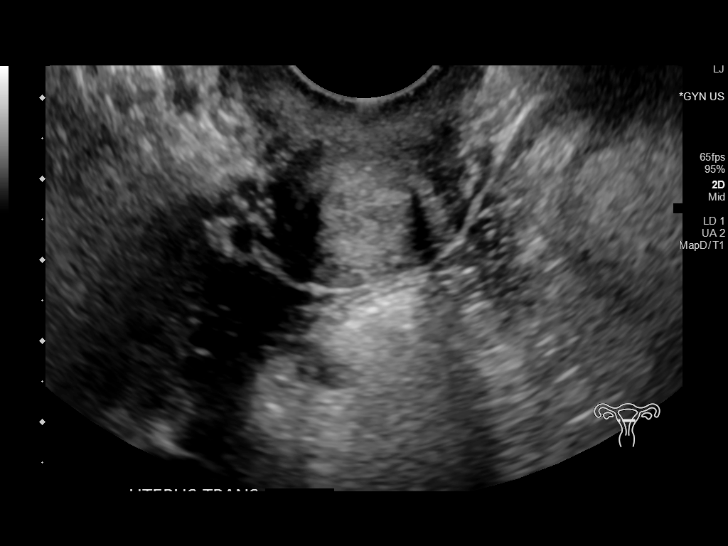
[im 77/115]
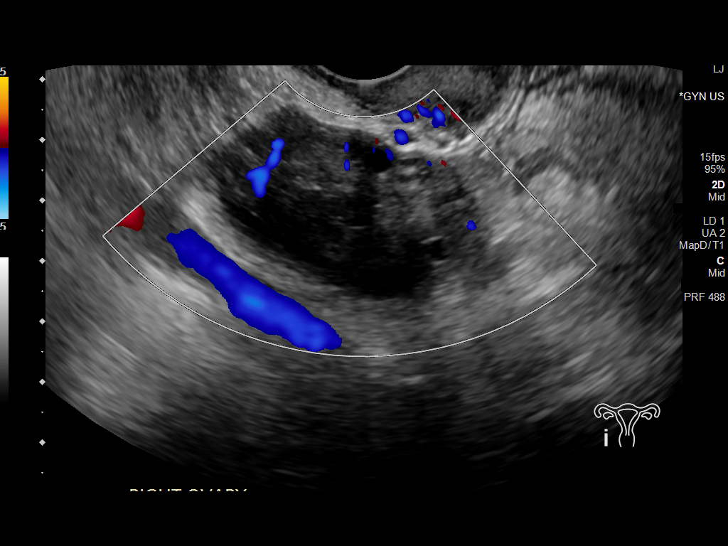
[im 86/115]
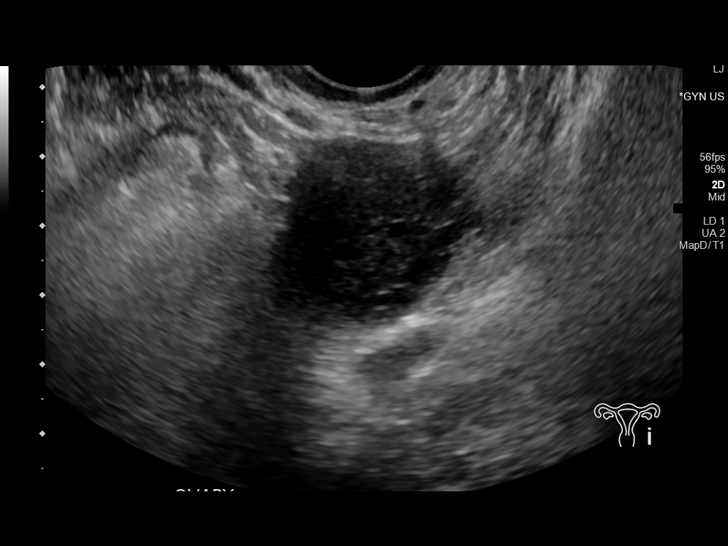
[im 96/115]
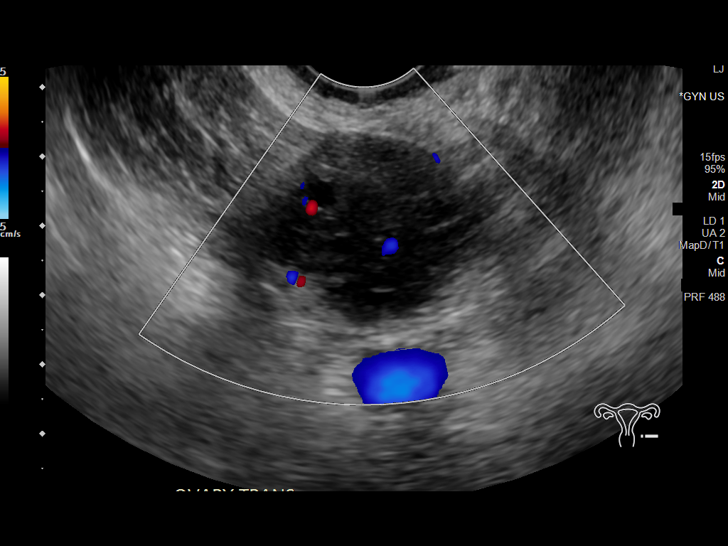
[im 105/115]
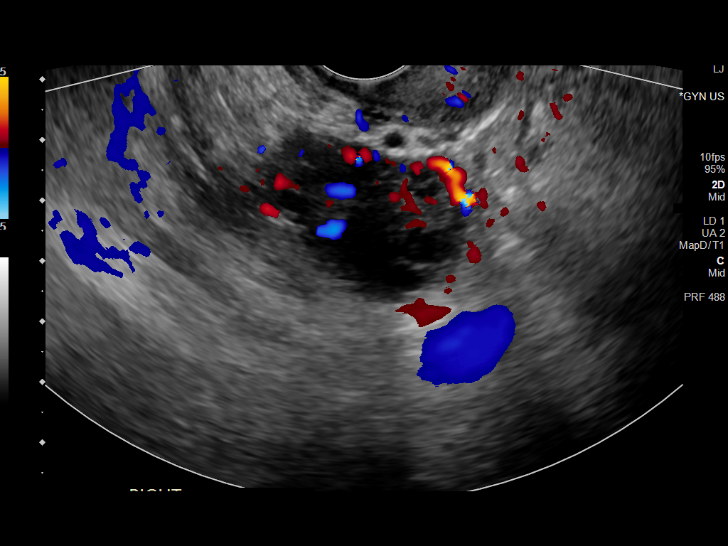
[im 115/115]
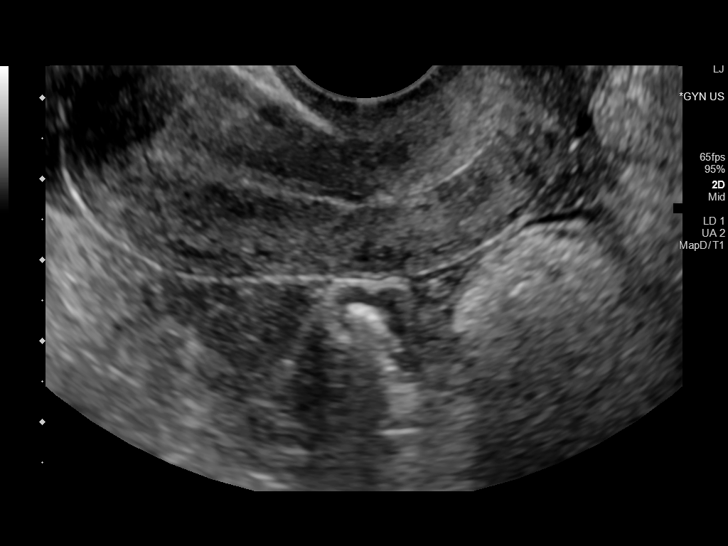

[13 of 25 positions shown; findings below may reference images not displayed]

FINDINGS: Uterus

Measurements: 6.6 x 2.5 x 2.5 cm = volume: 34 mL. No fibroids or
other mass visualized.

Endometrium

Thickness: 5 mm.  No focal abnormality visualized.

Right ovary

Measurements: 4.3 x 2.6 x 3.3 cm = volume: 19 mL. Normal
appearance/no adnexal mass.

Left ovary

Measurements: 3.2 x 2.7 x 2.2 cm = volume: 10 mL. Normal
appearance/no adnexal mass.

Pulsed Doppler evaluation of both ovaries demonstrates normal
low-resistance arterial and venous waveforms.

Other findings

No abnormal free fluid.
IMPRESSION: Unremarkable pelvic ultrasound.

## 2022-06-05 ENCOUNTER — Telehealth: Payer: Self-pay | Admitting: Family Medicine

## 2022-06-05 NOTE — Telephone Encounter (Signed)
FYI: This call has been transferred to Access Nurse. Once the result note has been entered staff can address the message at that time.  Patient called in with the following symptoms: pt complaining of headaches for last two days and she has been lightheaded off and on.   Red Word:headaches/lightheadedness   Please advise at Mobile [phone] (mobile)  Message is routed to Provider Pool and [facility] Triage

## 2022-06-07 NOTE — Telephone Encounter (Signed)
Did not receive triage note. Called patient, she states her phone messed up and she did not speak to them. She states she did not sleep well for two nights. Developed headache/lightheaded feeling yesterday. She took tylenol and had a nap and felt better. No current symptoms. She will call with any changes. [name] - FYI.

## 2022-06-30 ENCOUNTER — Emergency Department (HOSPITAL_BASED_OUTPATIENT_CLINIC_OR_DEPARTMENT_OTHER): Payer: Self-pay

## 2022-06-30 ENCOUNTER — Other Ambulatory Visit: Payer: Self-pay

## 2022-06-30 ENCOUNTER — Emergency Department (HOSPITAL_BASED_OUTPATIENT_CLINIC_OR_DEPARTMENT_OTHER)
Admission: EM | Admit: 2022-06-30 | Discharge: 2022-06-30 | Disposition: A | Discharge status: Home / Self Care | Payer: Self-pay | Attending: Emergency Medicine | Admitting: Emergency Medicine

## 2022-06-30 ENCOUNTER — Encounter (HOSPITAL_BASED_OUTPATIENT_CLINIC_OR_DEPARTMENT_OTHER): Payer: Self-pay | Admitting: Emergency Medicine

## 2022-06-30 DIAGNOSIS — R072 Precordial pain: Secondary | ICD-10-CM | POA: Insufficient documentation

## 2022-06-30 DIAGNOSIS — E876 Hypokalemia: Secondary | ICD-10-CM | POA: Insufficient documentation

## 2022-06-30 DIAGNOSIS — I10 Essential (primary) hypertension: Secondary | ICD-10-CM | POA: Insufficient documentation

## 2022-06-30 LAB — CBC
HCT: 44.4 % (ref 36.0–46.0)
Hemoglobin: 14.3 g/dL (ref 12.0–15.0)
MCH: 26.8 pg (ref 26.0–34.0)
MCHC: 32.2 g/dL (ref 30.0–36.0)
MCV: 83.1 fL (ref 80.0–100.0)
Platelets: 272 10*3/uL (ref 150–400)
RBC: 5.34 MIL/uL — ABNORMAL HIGH (ref 3.87–5.11)
RDW: 14.4 % (ref 11.5–15.5)
WBC: 10.7 10*3/uL — ABNORMAL HIGH (ref 4.0–10.5)
nRBC: 0 % (ref 0.0–0.2)

## 2022-06-30 LAB — BASIC METABOLIC PANEL
Anion gap: 10 (ref 5–15)
BUN: 9 mg/dL (ref 6–20)
CO2: 26 mmol/L (ref 22–32)
Calcium: 8.7 mg/dL — ABNORMAL LOW (ref 8.9–10.3)
Chloride: 100 mmol/L (ref 98–111)
Creatinine, Ser: 0.68 mg/dL (ref 0.44–1.00)
GFR, Estimated: >60 mL/min (ref >60)
Glucose, Bld: 89 mg/dL (ref 70–99)
Potassium: 3.2 mmol/L — ABNORMAL LOW (ref 3.5–5.1)
Sodium: 136 mmol/L (ref 135–145)

## 2022-06-30 LAB — PREGNANCY, URINE: Preg Test, Ur: NEGATIVE

## 2022-06-30 LAB — TROPONIN I (HIGH SENSITIVITY): Troponin I (High Sensitivity): <2 ng/L (ref <18)

## 2022-06-30 MED ORDER — POTASSIUM CHLORIDE CRYS ER 20 MEQ PO TBCR
40.0000 meq | EXTENDED_RELEASE_TABLET | Freq: Once | ORAL | Status: AC
  Administered 2022-06-30: 40 meq via ORAL
  Filled 2022-06-30: qty 2

## 2022-06-30 NOTE — Discharge Instructions (Signed)
You were seen in the emergency department today for chest pain.  As we discussed your lab work, EKG, chest x-ray all looked reassuring today.  I think that your symptoms could likely been related to stress.   I recommend monitoring your stress levels.  Continue to monitor how you are doing overall, and return to the emergency department for any new or worsening symptoms such as: Worsening pain or pain with exertion, difficulty breathing, sweating, or pain or swelling in your legs.  Follow-up with your primary care doctor in the next few days for continued evaluation and management of your symptoms.

## 2022-06-30 NOTE — ED Provider Notes (Signed)
[place] [facility] Provider Note   CSN: [id number] Arrival date & time: 06/30/22  1635     History  Chief Complaint  Patient presents with   Chest Pain    [name] is a 32 y.o. female.  Patient with history of hypertension not currently on medication presents today with complaints of chest pain.  She states that same began initially yesterday when she was sitting down at the table.  Pain is sharp in nature on the left side of her chest and radiates down her left arm.  She states it comes and intervals without any discernible trigger.  It lasted approximately 20 minutes and then spontaneously resolved without intervention.  It has continued into today as well.  Her last episode of pain ended around 2:00 this afternoon and she has been asymptomatic since then.  She denies any history of similar symptoms previously.  Denies any cardiac history.  When these intervals of pain occur, she is not nauseous, diaphoretic, or short of breath.  She denies any leg pain or leg swelling.  No recent travel or recent surgeries.  She is not on OCPs.  She states that she smokes cigarettes intermittently, denies any recreational drug use.  Denies any history of malignancy or blood clots. She is currently on her menstrual cycle. She does endorse that she has been under a lot more stress recently and suspects this to be the cause of her symptoms.   The history is provided by the patient. No language interpreter was used.  Chest Pain      Home Medications Prior to Admission medications   Medication Sig Start Date End Date Taking? Authorizing Provider  cetirizine (ZYRTEC) 10 MG tablet Take 1 tablet (10 mg total) by mouth daily. 11/29/21   Olive Bass, FNP  fluticasone (FLONASE) 50 MCG/ACT nasal spray Place 2 sprays into both nostrils daily. 11/29/21   Olive Bass, FNP  venlafaxine XR (EFFEXOR-XR) 37.5 MG 24 hr capsule Take 1 capsule (37.5 mg  total) by mouth daily with breakfast. 03/23/22   Clayborne Dana, NP      Allergies    Patient has no known allergies.    Review of Systems   Review of Systems  Cardiovascular:  Positive for chest pain.  All other systems reviewed and are negative.   Physical Exam Updated Vital Signs BP (!) 122/90   Pulse 70   Temp 98 F (36.7 C) (Oral)   Resp 15 Comment: Simultaneous filing. User may not have seen previous data.  Ht 5' (1.524 m)   Wt 80.7 kg   LMP 06/30/2022   SpO2 96%   BMI 34.76 kg/m  Physical Exam Vitals and nursing note reviewed.  Constitutional:      General: She is not in acute distress.    Appearance: Normal appearance. She is normal weight. She is not ill-appearing, toxic-appearing or diaphoretic.  HENT:     Head: Normocephalic and atraumatic.  Neck:     Vascular: No JVD.  Cardiovascular:     Rate and Rhythm: Normal rate and regular rhythm.     Pulses:          Dorsalis pedis pulses are 2+ on the right side and 2+ on the left side.       Posterior tibial pulses are 2+ on the right side and 2+ on the left side.     Heart sounds: Normal heart sounds.  Pulmonary:     Effort: Pulmonary  effort is normal. No respiratory distress.     Breath sounds: Normal breath sounds.  Chest:     Chest wall: No tenderness.  Abdominal:     Palpations: Abdomen is soft.     Tenderness: There is no abdominal tenderness.  Musculoskeletal:        General: Normal range of motion.     Cervical back: Normal range of motion and neck supple.     Right lower leg: No tenderness. No edema.     Left lower leg: No tenderness. No edema.  Skin:    General: Skin is warm and dry.  Neurological:     General: No focal deficit present.     Mental Status: She is alert.  Psychiatric:        Mood and Affect: Mood normal.        Behavior: Behavior normal.     ED Results / Procedures / Treatments   Labs (all labs ordered are listed, but only abnormal results are displayed) Labs Reviewed   BASIC METABOLIC PANEL - Abnormal; Notable for the following components:      Result Value   Potassium 3.2 (*)    Calcium 8.7 (*)    All other components within normal limits  CBC - Abnormal; Notable for the following components:   WBC 10.7 (*)    RBC 5.34 (*)    All other components within normal limits  PREGNANCY, URINE  TROPONIN I (HIGH SENSITIVITY)    EKG EKG Interpretation  Date/Time:  Friday Jun 30 2022 16:43:51 EDT Ventricular Rate:  86 PR Interval:  148 QRS Duration: 74 QT Interval:  349 QTC Calculation: 418 R Axis:   65 Text Interpretation: Sinus rhythm when compared to prior, similar appearance. NO STEMI Confirmed by Theda Belfast ([id number]) on 06/30/2022 5:41:07 PM  Radiology DG Chest 2 View  Result Date: 06/30/2022 CLINICAL DATA:  chest pain EXAM: CHEST - 2 VIEW COMPARISON:  Chest XR, 05/19/2022 FINDINGS: Cardiomediastinal silhouette is within normal limits. Lungs are well inflated. No focal consolidation or mass. No pleural effusion or pneumothorax. No acute displaced fracture. IMPRESSION: No acute cardiopulmonary process. Electronically Signed   By: Roanna Banning M.D.   On: 06/30/2022 17:19    Procedures Procedures    Medications Ordered in ED Medications  potassium chloride SA (KLOR-CON M) CR tablet 40 mEq (40 mEq Oral Given 06/30/22 2018)    ED Course/ Medical Decision Making/ A&P                             Medical Decision Making Amount and/or Complexity of Data Reviewed Labs: ordered. Radiology: ordered.  Risk Prescription drug management.   This patient is a 32 y.o. female who presents to the ED for concern of chest pain, this involves an extensive number of treatment options, and is a complaint that carries with it a high risk of complications and morbidity. The emergent differential diagnosis prior to evaluation includes, but is not limited to,  ACS, pericarditis, myocarditis, aortic dissection, PE, pneumothorax, esophageal spasm or rupture,  chronic angina, pneumonia, bronchitis, GERD, reflux/PUD, biliary disease, pancreatitis, costochondritis, anxiety  This is not an exhaustive differential.   Past Medical History / Co-morbidities / Social History: Hx hypertension not currently on medication  Physical Exam: Physical exam performed. The pertinent findings include: per above, no pertinent physical exam findings  Lab Tests: I ordered, and personally interpreted labs.  The pertinent results include:  WBC 10.7, K  3.2, troponin <2   Imaging Studies: I ordered imaging studies including CXR. I independently visualized and interpreted imaging which showed NAD. I agree with the radiologist interpretation.   Cardiac Monitoring:  The patient was maintained on a cardiac monitor.  Cardiac monitor showed an underlying rhythm of: sinus rhythm, no STEMI. I agree with this interpretation.   Disposition: After consideration of the diagnostic results and the patients response to treatment, I feel that emergency department workup does not suggest an emergent condition requiring admission or immediate intervention beyond what has been performed at this time. The plan is: Discharge with close outpatient follow-up.  She has been asymptomatic for the last 6 hours.  She is PERC negative.  Her workup is benign and she has no comorbid risk factors.  Her heart score is 0. Given oral potassium today given hypokalemia. Evaluation and diagnostic testing in the emergency department does not suggest an emergent condition requiring admission or immediate intervention beyond what has been performed at this time.  Plan for discharge with close PCP follow-up.  Patient is understanding and amenable with plan, educated on red flag symptoms that would prompt immediate return.  Patient discharged in stable condition.   Final Clinical Impression(s) / ED Diagnoses Final diagnoses:  Precordial chest pain  Hypokalemia    Rx / DC Orders ED Discharge Orders     None      An After Visit Summary was printed and given to the patient.     Vear Clock 06/30/22 2109    Tegeler, Canary Brim, MD 06/30/22 2149

## 2022-06-30 NOTE — ED Triage Notes (Signed)
Chest pain earlier today, with some radiation down left arm.  No known fever.  No sob.  No travel.

## 2022-07-20 ENCOUNTER — Other Ambulatory Visit: Payer: Self-pay

## 2022-07-20 ENCOUNTER — Emergency Department (HOSPITAL_BASED_OUTPATIENT_CLINIC_OR_DEPARTMENT_OTHER)
Admission: EM | Admit: 2022-07-20 | Discharge: 2022-07-20 | Disposition: A | Discharge status: Home / Self Care | Payer: Self-pay | Attending: Emergency Medicine | Admitting: Emergency Medicine

## 2022-07-20 ENCOUNTER — Emergency Department (HOSPITAL_BASED_OUTPATIENT_CLINIC_OR_DEPARTMENT_OTHER): Payer: Self-pay

## 2022-07-20 ENCOUNTER — Encounter (HOSPITAL_BASED_OUTPATIENT_CLINIC_OR_DEPARTMENT_OTHER): Payer: Self-pay

## 2022-07-20 DIAGNOSIS — R42 Dizziness and giddiness: Secondary | ICD-10-CM | POA: Insufficient documentation

## 2022-07-20 DIAGNOSIS — R0602 Shortness of breath: Secondary | ICD-10-CM | POA: Insufficient documentation

## 2022-07-20 DIAGNOSIS — R55 Syncope and collapse: Secondary | ICD-10-CM | POA: Insufficient documentation

## 2022-07-20 DIAGNOSIS — R000 Tachycardia, unspecified: Secondary | ICD-10-CM | POA: Insufficient documentation

## 2022-07-20 DIAGNOSIS — Z8679 Personal history of other diseases of the circulatory system: Secondary | ICD-10-CM | POA: Insufficient documentation

## 2022-07-20 LAB — BRAIN NATRIURETIC PEPTIDE: B Natriuretic Peptide: 17.3 pg/mL (ref 0.0–100.0)

## 2022-07-20 LAB — COMPREHENSIVE METABOLIC PANEL
ALT: 17 U/L (ref 0–44)
AST: 17 U/L (ref 15–41)
Albumin: 4.1 g/dL (ref 3.5–5.0)
Alkaline Phosphatase: 95 U/L (ref 38–126)
Anion gap: 6 (ref 5–15)
BUN: 14 mg/dL (ref 6–20)
CO2: 27 mmol/L (ref 22–32)
Calcium: 8.9 mg/dL (ref 8.9–10.3)
Chloride: 101 mmol/L (ref 98–111)
Creatinine, Ser: 0.55 mg/dL (ref 0.44–1.00)
GFR, Estimated: >60 mL/min (ref >60)
Glucose, Bld: 93 mg/dL (ref 70–99)
Potassium: 3.8 mmol/L (ref 3.5–5.1)
Sodium: 134 mmol/L — ABNORMAL LOW (ref 135–145)
Total Bilirubin: 0.1 mg/dL — ABNORMAL LOW (ref 0.3–1.2)
Total Protein: 8.1 g/dL (ref 6.5–8.1)

## 2022-07-20 LAB — CBC WITH DIFFERENTIAL/PLATELET
Abs Immature Granulocytes: 0.07 10*3/uL (ref 0.00–0.07)
Basophils Absolute: 0.1 10*3/uL (ref 0.0–0.1)
Basophils Relative: 0 %
Eosinophils Absolute: 0.1 10*3/uL (ref 0.0–0.5)
Eosinophils Relative: 1 %
HCT: 43.7 % (ref 36.0–46.0)
Hemoglobin: 14 g/dL (ref 12.0–15.0)
Immature Granulocytes: 1 %
Lymphocytes Relative: 17 %
Lymphs Abs: 1.9 10*3/uL (ref 0.7–4.0)
MCH: 27.1 pg (ref 26.0–34.0)
MCHC: 32 g/dL (ref 30.0–36.0)
MCV: 84.7 fL (ref 80.0–100.0)
Monocytes Absolute: 0.6 10*3/uL (ref 0.1–1.0)
Monocytes Relative: 5 %
Neutro Abs: 8.6 10*3/uL — ABNORMAL HIGH (ref 1.7–7.7)
Neutrophils Relative %: 76 %
Platelets: 284 10*3/uL (ref 150–400)
RBC: 5.16 MIL/uL — ABNORMAL HIGH (ref 3.87–5.11)
RDW: 14.5 % (ref 11.5–15.5)
WBC: 11.2 10*3/uL — ABNORMAL HIGH (ref 4.0–10.5)
nRBC: 0 % (ref 0.0–0.2)

## 2022-07-20 LAB — TROPONIN I (HIGH SENSITIVITY): Troponin I (High Sensitivity): 2 ng/L (ref <18)

## 2022-07-20 LAB — HCG, QUANTITATIVE, PREGNANCY: hCG, Beta Chain, Quant, S: <1 m[IU]/mL (ref <5)

## 2022-07-20 LAB — D-DIMER, QUANTITATIVE: D-Dimer, Quant: 0.4 ug/mL-FEU (ref 0.00–0.50)

## 2022-07-20 MED ORDER — METOPROLOL TARTRATE 25 MG PO TABS: ORAL_TABLET | ORAL | 10 refills | Status: AC

## 2022-07-20 NOTE — ED Notes (Signed)
Pt refused IV. Requested blood draw only.

## 2022-07-20 NOTE — ED Triage Notes (Signed)
Pt reports she started feeling anxious today at work around 1450 and then began to feel short of breath with a HR of 130. She now reports feeling dizzy. Denies feeling any pain but endorses mild shortness of breath. Hx of anxiety, not taking meds.

## 2022-07-20 NOTE — Discharge Instructions (Signed)
Contact a health care provider if: You have vomiting or diarrhea that does not go away. You have a fever. You have weakness or dizziness. You feel faint. Get help right away if: You have pain in your chest, upper arms, jaw, or neck. You have palpitations that do not go away. 

## 2022-07-20 NOTE — ED Provider Notes (Signed)
[facility] Provider Note   CSN: [id number] Arrival date & time: 07/20/22  1459     History  Chief Complaint  Patient presents with   Anxiety    [name] is a 32 y.o. female who presents for racing heart and near syncope.  She has a past medical history of the same.  She has been seen previously for the same symptoms.  She reports that she was at work today when she had sudden onset of racing heart followed by feeling like she was getting lightheaded and short of breath.  She states this lasted for approximately 1 hour.  Her blood pressure was notably high and her heart rate was 140 at work.  She has been seen by cardiology in the past and was diagnosed with cardiomyopathy.  She used to be on a beta-blocker and then was placed on hydrochlorothiazide by her nurse practitioner.  She stopped taking this medication because it makes her feel lightheaded.  She was placed on affect sore in February states that she does not feel like this is helped much.  She thought maybe she was having a panic attack but states that she does not feel anxious daily and does not feel like she was anxious at the time that this occurred.  She denies having an overwhelming sense of doom.  She left and came here for further evaluation to make sure there was nothing else that was going on that could be life-threatening.  She reports that she does not see a cardiologist on a regular basis despite having the diagnosis of cardiomyopathy.  She states has been sometime since she had this event occur.  She denies a history of DVT or PE.  She does not take exogenous estrogens.   Anxiety       Home Medications Prior to Admission medications   Medication Sig Start Date End Date Taking? Authorizing Provider  metoprolol tartrate (LOPRESSOR) 25 MG tablet Take one table by mouth at onset of Racing Heart symptoms. My repeat in 6 hours. 07/20/22  Yes Mickel Schreur, PA-C   cetirizine (ZYRTEC) 10 MG tablet Take 1 tablet (10 mg total) by mouth daily. 11/29/21   Olive Bass, FNP  fluticasone (FLONASE) 50 MCG/ACT nasal spray Place 2 sprays into both nostrils daily. 11/29/21   Olive Bass, FNP  venlafaxine XR (EFFEXOR-XR) 37.5 MG 24 hr capsule Take 1 capsule (37.5 mg total) by mouth daily with breakfast. 03/23/22   Clayborne Dana, NP      Allergies    Patient has no known allergies.    Review of Systems   Review of Systems  Physical Exam Updated Vital Signs BP 118/87   Pulse 81   Temp 98.3 F (36.8 C) (Oral)   Resp 20   Ht 5' (1.524 m)   Wt 80.7 kg   LMP 06/28/2022 (Approximate)   SpO2 99%   BMI 34.76 kg/m  Physical Exam Vitals and nursing note reviewed.  Constitutional:      General: She is not in acute distress.    Appearance: She is well-developed. She is not diaphoretic.  HENT:     Head: Normocephalic and atraumatic.     Right Ear: External ear normal.     Left Ear: External ear normal.     Nose: Nose normal.     Mouth/Throat:     Mouth: Mucous membranes are moist.  Eyes:     General: No scleral icterus.  Conjunctiva/sclera: Conjunctivae normal.  Cardiovascular:     Rate and Rhythm: Normal rate and regular rhythm.     Heart sounds: Normal heart sounds. No murmur heard.    No friction rub. No gallop.  Pulmonary:     Effort: Pulmonary effort is normal. No respiratory distress.     Breath sounds: Normal breath sounds.  Abdominal:     General: Bowel sounds are normal. There is no distension.     Palpations: Abdomen is soft. There is no mass.     Tenderness: There is no abdominal tenderness. There is no guarding.  Musculoskeletal:     Cervical back: Normal range of motion.     Right lower leg: No edema.     Left lower leg: No edema.  Skin:    General: Skin is warm and dry.  Neurological:     Mental Status: She is alert and oriented to person, place, and time.  Psychiatric:        Behavior: Behavior normal.      ED Results / Procedures / Treatments   Labs (all labs ordered are listed, but only abnormal results are displayed) Labs Reviewed  CBC WITH DIFFERENTIAL/PLATELET - Abnormal; Notable for the following components:      Result Value   WBC 11.2 (*)    RBC 5.16 (*)    Neutro Abs 8.6 (*)    All other components within normal limits  COMPREHENSIVE METABOLIC PANEL - Abnormal; Notable for the following components:   Sodium 134 (*)    Total Bilirubin 0.1 (*)    All other components within normal limits  BRAIN NATRIURETIC PEPTIDE  D-DIMER, QUANTITATIVE  HCG, QUANTITATIVE, PREGNANCY  RAPID URINE DRUG SCREEN, HOSP PERFORMED  TSH  TROPONIN I (HIGH SENSITIVITY)  TROPONIN I (HIGH SENSITIVITY)    EKG EKG Interpretation  Date/Time:  Thursday July 20 2022 15:08:13 EDT Ventricular Rate:  109 PR Interval:  168 QRS Duration: 79 QT Interval:  317 QTC Calculation: 427 R Axis:   52 Text Interpretation: Sinus tachycardia Confirmed by Glyn Ade [phone]) on 07/20/2022 3:52:06 PM  Radiology DG Chest Port 1 View  Result Date: 07/20/2022 CLINICAL DATA:  Shortness of breath, dizziness, hypertension EXAM: PORTABLE CHEST 1 VIEW COMPARISON:  06/30/2022 FINDINGS: Cardiac and mediastinal contours are within normal limits. No focal pulmonary opacity. No pleural effusion or pneumothorax. No acute osseous abnormality. IMPRESSION: No acute cardiopulmonary process. Electronically Signed   By: Wiliam Ke M.D.   On: 07/20/2022 17:54    Procedures Procedures    Medications Ordered in ED Medications - No data to display  ED Course/ Medical Decision Making/ A&P Clinical Course as of 07/20/22 2309  Thu Jul 20, 2022  1853 STable 15 YOF with  chief complaint of anxiety HX of CM. Sinus tach in the past and follows with cardiology [CC]    Clinical Course User Index [CC] Glyn Ade, MD                             Medical Decision Making Amount and/or Complexity of Data Reviewed Labs:  ordered. Radiology: ordered.  Risk Prescription drug management.   This patient presents to the ED for concern of racing hear and near syncope, this involves an extensive number of treatment options, and is a complaint that carries with it a high risk of complications and morbidity.  The differential for syncope is extensive and includes, but is not limited to: arrythmia (Vtach, SVT, SSS,  sinus arrest, AV block, bradycardia) aortic stenosis, AMI, HOCM, PE, atrial myxoma, pulmonary hypertension, orthostatic hypotension, (hypovolemia, drug effect, GB syndrome, micturition, cough, swall) carotid sinus sensitivity, Seizure, TIA/CVA, hypoglycemia,  Vertigo. The differential diagnosis for palpitations includes cardiac arrhythmias, PVC/PAC, ACS, Cardiomyopathy, CHF, MVP, pericarditis, valvular disease, Panic/Anxiety, Somatic disorder, ETOH, Caffeine,  Stimulant use, medication side effect, Anemia, Hyperthyroidism, pulmonary embolism.   Co morbidities: Cardiomyopathy, hx of tachycardia  Social Determinants of Health:   SDOH Screenings   Depression (PHQ2-9): High Risk (03/23/2022)  Tobacco Use: Low Risk  (07/20/2022)     Additional history:  {Additional history obtained from emr   Lab Tests:  I Ordered, and personally interpreted labs.  The pertinent results include:   Normal hemoglobin, troponin negative x 2.  Labs are otherwise unremarkable  Imaging Studies:  I ordered imaging studies including chest x-ray I independently visualized and interpreted imaging which showed no acute findings I agree with the radiologist interpretation  Cardiac Monitoring/ECG:  The patient was maintained on a cardiac monitor.  I personally viewed and interpreted the cardiac monitored which showed an underlying rhythm of: Sinus tachycardia  Medicines ordered and prescription drug management:   Test Considered:   TSH currently pending  Critical Interventions:    Consultations  Obtained:   Problem List / ED Course:     ICD-10-CM   1. Sinus tachycardia  R00.0 Ambulatory referral to Cardiology    2. Hx of cardiomyopathy  Z86.79 Ambulatory referral to Cardiology    3. Near syncope  R55 Ambulatory referral to Cardiology      MDM: Patient being treated for anxiety and depression.  Surely she presents with a mixed picture however she states that she was not feeling anxious and had really onset of racing heart followed by shortness of breath and near syncope not an overwhelming sense of dread and panic.  I suspect that this is more likely related to true tachycardia in the setting of cardiomyopathy.  She is not on any kind of beta-blocker or rate reducer.  She is not following with a cardiologist at this time.  Will refer to cardiology with ambulatory referral and give the patient a prescription for metoprolol to take at the onset of racing heart.  Have her follow closely with her primary care doctor and her cardiologist.   Dispostion:  After consideration of the diagnostic results and the patients response to treatment, I feel that the patent would benefit from discharge.         Final Clinical Impression(s) / ED Diagnoses Final diagnoses:  Sinus tachycardia  Hx of cardiomyopathy  Near syncope    Rx / DC Orders ED Discharge Orders          Ordered    metoprolol tartrate (LOPRESSOR) 25 MG tablet        07/20/22 1857    Ambulatory referral to Cardiology       Comments: If you have not heard from the Cardiology office within the next 72 hours please call [phone].   07/20/22 1908              Arthor Captain, PA-C 07/20/22 2310    Glyn Ade, MD 07/22/22 1247

## 2022-07-21 LAB — TSH: TSH: 1.266 u[IU]/mL (ref 0.350–4.500)

## 2022-08-09 ENCOUNTER — Telehealth: Payer: Self-pay | Admitting: Family Medicine

## 2022-08-09 NOTE — Telephone Encounter (Signed)
Needs appt (CMA approved)- thanks!

## 2022-08-09 NOTE — Telephone Encounter (Signed)
Patient said she is having some mild belly button pain. She notices it when she walks, bends over and touches it. She said it's not too painful that she can't stand. Pt was offered an appt but wants to see what the CMA has to say about it first.

## 2022-08-10 ENCOUNTER — Ambulatory Visit (INDEPENDENT_AMBULATORY_CARE_PROVIDER_SITE_OTHER): Payer: Self-pay | Admitting: Family Medicine

## 2022-08-10 ENCOUNTER — Encounter: Payer: Self-pay | Admitting: Family Medicine

## 2022-08-10 VITALS — BP 111/80 | HR 74 | Ht 60.0 in | Wt 188.0 lb

## 2022-08-10 DIAGNOSIS — Q899 Congenital malformation, unspecified: Secondary | ICD-10-CM

## 2022-08-10 MED ORDER — MUPIROCIN 2 % EX OINT
1.0000 | TOPICAL_OINTMENT | Freq: Two times a day (BID) | CUTANEOUS | 1 refills | Status: DC
Start: 2022-08-10 — End: 2023-04-03

## 2022-08-10 NOTE — Progress Notes (Signed)
Acute Office Visit  Subjective:     Patient ID: [name], female    DOB: 13-Jul-1990, 32 y.o.   MRN: [id number]  Chief Complaint  Patient presents with   Abdominal Pain    At umbilicus     Patient is in today for umbilicus discomfort.    Discussed the use of AI scribe software for clinical note transcription with the patient, who gave verbal consent to proceed.  History of Present Illness   The patient presented with a chief complaint of discomfort and a dull ache in the belly button area for the past few days. The discomfort was noted to be exacerbated by certain movements such as walking and bending down. The pain was localized to the belly button area, with no associated pain on either side of the abdomen. The patient rated the pain as a 5 on a scale of 10, and it was noted to subside upon cessation of the triggering activity.  The patient also reported an episode of clear drainage from the belly button, which lasted for a few minutes and then stopped. There was no history of any belly button ring or similar adornments. The patient denied any associated symptoms such as fever, chills, body aches, other abdominal pain, nausea, vomiting, or diarrhea. There was no reported swelling or persistent redness at the belly button site.           All review of systems negative except what is listed in the HPI      Objective:    BP 111/80   Pulse 74   Ht 5' (1.524 m)   Wt 188 lb (85.3 kg)   LMP 06/28/2022 (Approximate)   SpO2 100%   BMI 36.72 kg/m    Physical Exam Vitals reviewed.  Constitutional:      Appearance: She is well-developed.  Abdominal:     General: Bowel sounds are normal.     Tenderness: There is no abdominal tenderness. There is no guarding or rebound. Negative signs include Murphy's sign and McBurney's sign.     Hernia: There is no hernia in the umbilical area or ventral area.     Comments: Deep umbilicus with minimal erythema and lint. Possible  abrasion. No drainage noted. No induration, mass, edema, pain, etc.   Skin:    General: Skin is warm and dry.  Neurological:     Mental Status: She is alert and oriented to person, place, and time.  Psychiatric:        Mood and Affect: Mood normal.        Behavior: Behavior normal.        No results found for any visits on 08/10/22.      Assessment & Plan:   Problem List Items Addressed This Visit   None Visit Diagnoses     Umbilical abnormality    -  Primary Clean with warm soapy water. Pat dry. Apply antibiotic ointment twice daily for 1-2 weeks. If any worsening symptoms (severe pain, swelling, yellow drainage, spreading redness, etc) please let me know so we can change to oral antibiotics and further assess for other causes if needed. No evidence of hernia today, but can evaluate with imaging if symptoms persist.    Relevant Medications   mupirocin ointment (BACTROBAN) 2 %       Meds ordered this encounter  Medications   mupirocin ointment (BACTROBAN) 2 %    Sig: Apply 1 Application topically 2 (two) times daily.    Dispense:  22  g    Refill:  1    Order Specific Question:   Supervising Provider    Answer:   Danise Edge A [[id number]]    Return if symptoms worsen or fail to improve.  Clayborne Dana, NP

## 2022-08-10 NOTE — Patient Instructions (Addendum)
Keep belly button clean with warm soapy water. Pat dry. Apply antibiotic ointment twice daily for 1-2 weeks. If any worsening symptoms (severe pain, swelling, yellow drainage, spreading redness, etc) please let me know so we can change to oral antibiotics and further assess for other causes if needed. No evidence of hernia today, but can evaluate with imaging if symptoms persist.

## 2022-09-26 DIAGNOSIS — I499 Cardiac arrhythmia, unspecified: Secondary | ICD-10-CM | POA: Insufficient documentation

## 2022-09-26 DIAGNOSIS — I10 Essential (primary) hypertension: Secondary | ICD-10-CM | POA: Insufficient documentation

## 2022-09-26 DIAGNOSIS — D2330 Other benign neoplasm of skin of unspecified part of face: Secondary | ICD-10-CM | POA: Insufficient documentation

## 2022-09-26 DIAGNOSIS — R002 Palpitations: Secondary | ICD-10-CM | POA: Insufficient documentation

## 2022-09-26 DIAGNOSIS — F32A Depression, unspecified: Secondary | ICD-10-CM | POA: Insufficient documentation

## 2022-09-26 DIAGNOSIS — F419 Anxiety disorder, unspecified: Secondary | ICD-10-CM | POA: Insufficient documentation

## 2022-09-28 ENCOUNTER — Ambulatory Visit: Payer: Self-pay | Attending: Cardiology | Admitting: Cardiology

## 2022-12-06 ENCOUNTER — Ambulatory Visit: Payer: Self-pay | Attending: Cardiology | Admitting: Cardiology

## 2023-02-07 ENCOUNTER — Ambulatory Visit (INDEPENDENT_AMBULATORY_CARE_PROVIDER_SITE_OTHER): Payer: Self-pay | Admitting: Family

## 2023-02-07 VITALS — BP 123/81 | HR 102 | Temp 98.8°F | Resp 16 | Ht 60.0 in | Wt 204.0 lb

## 2023-02-07 DIAGNOSIS — N912 Amenorrhea, unspecified: Secondary | ICD-10-CM

## 2023-02-07 DIAGNOSIS — J029 Acute pharyngitis, unspecified: Secondary | ICD-10-CM

## 2023-02-07 DIAGNOSIS — J069 Acute upper respiratory infection, unspecified: Secondary | ICD-10-CM

## 2023-02-07 DIAGNOSIS — R0981 Nasal congestion: Secondary | ICD-10-CM

## 2023-02-07 LAB — POC INFLUENZA A&B (BINAX/QUICKVUE)
Influenza A, POC: NEGATIVE
Influenza B, POC: NEGATIVE

## 2023-02-07 LAB — POCT RAPID STREP A (OFFICE): Rapid Strep A Screen: NEGATIVE

## 2023-02-07 LAB — POCT URINE PREGNANCY: Preg Test, Ur: NEGATIVE

## 2023-02-07 NOTE — Progress Notes (Signed)
 Subjective:     Patient ID: [name], female    DOB: 05/16/1990, 33 y.o.   MRN: [id number]  Chief Complaint  Patient presents with   Sore Throat    Complains of sore throat that started Sunday   Nasal Congestion    Complains of congestion since Monday   Generalized Body Aches    Complains of on and off body aches     Discussed the use of AI scribe software for clinical note transcription with the patient, who gave verbal consent to proceed.  History of Present Illness   The patient, with a history of hypertension, presents with a four-day history of upper respiratory symptoms. She initially noticed a scratchy throat on Saturday, which improved slightly but was followed by the onset of nasal congestion and sinus pressure.  She has been using over-the-counter remedies such as cough drops and has been trying to stay hydrated. Despite these efforts, she reports that her symptoms have not improved and she continues to feel unwell. She also reports intermittent body aches and a persistent headache. She has tested negative for COVID-19 twice at home.   The patient also reports amenorrhea for the past month, which is causing her some stress. She has taken a home pregnancy test, which was negative, but she is concerned that it may have been too early to detect a pregnancy. She reports unprotected sexual activity and is requesting a pregnancy test to confirm the result.          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     Anxiety    Cardiomyopathy (HCC) 06/09/2010   Depression    Epistaxis 08/28/2012   Facial angiofibroma    Heart palpitations    Hypertension    Insomnia 04/07/2013   Irregular heartbeat    Nonspecific abnormal  electrocardiogram (ECG) (EKG) 06/07/2010   Palpitations 06/23/2011   Preop cardiovascular exam 06/07/2010   Primary hypertension 05/16/2021    Past Surgical History:  Procedure Laterality Date   NASAL HEMORRHAGE CONTROL Bilateral 08/28/2012   Procedure: EPISTAXIS CONTROL;  Surgeon: Alm Bouche, MD;  Location: [facility] OR;  Service: ENT;  Laterality: Bilateral;    Family History  Problem Relation Age of Onset   Heart disease Father        Unknown type    Social History   Socioeconomic History   Marital status: Single    Spouse name: N/A   Number of children: 0   Years of education: Not on file   Highest education level: Not on file  Occupational History    Employer: [employer]   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  Family from [place].       Social Drivers of Corporate Investment Banker Strain: Not  on file  Food Insecurity: Not on file  Transportation Needs: Not on file  Physical Activity: Not on file  Stress: Not on file  Social Connections: Not on file  Intimate Partner Violence: Not on file    Outpatient Medications Prior to Visit  Medication Sig Dispense Refill   cetirizine  (ZYRTEC ) 10 MG tablet Take 1 tablet (10 mg total) by mouth daily. 30 tablet 11   fluticasone  (FLONASE ) 50 MCG/ACT nasal spray Place 2 sprays into both nostrils daily. 16 g 6   metoprolol  tartrate (LOPRESSOR ) 25 MG tablet Take one table by mouth at onset of Racing Heart symptoms. My repeat in 6 hours. (Patient taking differently: Take 25 mg by mouth See admin instructions. Take one table by mouth at onset of Racing Heart symptoms. My repeat in 6  hours.) 10 tablet 10   mupirocin  ointment (BACTROBAN ) 2 % Apply 1 Application topically 2 (two) times daily. 22 g 1   venlafaxine  XR (EFFEXOR -XR) 37.5 MG 24 hr capsule Take 1 capsule (37.5 mg total) by mouth daily with breakfast. 60 capsule 0   No facility-administered medications prior to visit.    No Known Allergies  ROS    See HPI Objective:    Physical Exam Constitutional:      General: She is not in acute distress.    Appearance: Normal appearance. She is well-developed.  HENT:     Head: Normocephalic and atraumatic.     Right Ear: Ear canal and external ear normal. A middle ear effusion is present. Tympanic membrane is not erythematous or bulging.     Left Ear: Ear canal and external ear normal. A middle ear effusion is present. Tympanic membrane is not erythematous or bulging.  Eyes:     General: No scleral icterus. Neck:     Thyroid : No thyromegaly.  Cardiovascular:     Rate and Rhythm: Normal rate and regular rhythm.     Heart sounds: Normal heart sounds. No murmur heard. Pulmonary:     Effort: Pulmonary effort is normal. No respiratory distress.     Breath sounds: Normal breath sounds. No wheezing.  Musculoskeletal:     Cervical back: Neck supple.  Skin:    General: Skin is warm and dry.  Neurological:     Mental Status: She is alert and oriented to person, place, and time.  Psychiatric:        Mood and Affect: Mood normal.        Behavior: Behavior normal.        Thought Content: Thought content normal.        Judgment: Judgment normal.      BP 123/81 (BP Location: Right Arm, Patient Position: Sitting, Cuff Size: Large)   Pulse (!) 102   Temp 98.8 F (37.1 C) (Oral)   Resp 16   Ht 5' (1.524 m)   Wt 204 lb (92.5 kg)   SpO2 99%   BMI 39.84 kg/m  Wt Readings from Last 3 Encounters:  02/07/23 204 lb (92.5 kg)  08/10/22 188 lb (85.3 kg)  07/20/22 178 lb (80.7 kg)       Assessment & Plan:   Problem List Items Addressed This Visit       High    Amenorrhea    Missed period for one month, negative home pregnancy test. -Pregnancy test repeated here today and is negative. -Monitor for return of menses.  She has had some recent stress as potential cause.      Relevant Orders  POCT urine pregnancy (Completed)     Unprioritized   Viral URI    Symptoms started four days ago with scratchy throat, progressing to nasal congestion, headache, and intermittent body aches. No improvement with home remedies. Physical exam shows clear fluid behind eardrums and mild throat redness, but no signs of infection. - flu A and B swab and strep swabs are negative. -Continue supportive care with Tylenol  as needed, Mucinex, nasal saline spray, fluids, and rest. -Return to work on Saturday if feeling better, otherwise contact clinic. -Call if symptoms do not improve in the next few days       Other Visit Diagnoses       Congestion of nasal sinus    -  Primary   Relevant Orders   POC Influenza A&B (Binax test) (Completed)     Sore throat       Relevant Orders   POCT rapid strep A (Completed)       I am having Chenise Men maintain her fluticasone , cetirizine , venlafaxine  XR, metoprolol  tartrate, and mupirocin  ointment.  No orders of the defined types were placed in this encounter.

## 2023-02-08 DIAGNOSIS — N912 Amenorrhea, unspecified: Secondary | ICD-10-CM | POA: Insufficient documentation

## 2023-02-08 DIAGNOSIS — J069 Acute upper respiratory infection, unspecified: Secondary | ICD-10-CM | POA: Insufficient documentation

## 2023-02-08 NOTE — Assessment & Plan Note (Signed)
  Missed period for one month, negative home pregnancy test. -Pregnancy test repeated here today and is negative. -Monitor for return of menses.  She has had some recent stress as potential cause.

## 2023-02-08 NOTE — Assessment & Plan Note (Signed)
  Symptoms started four days ago with scratchy throat, progressing to nasal congestion, headache, and intermittent body aches. No improvement with home remedies. Physical exam shows clear fluid behind eardrums and mild throat redness, but no signs of infection. - flu A and B swab and strep swabs are negative. -Continue supportive care with Tylenol  as needed, Mucinex, nasal saline spray, fluids, and rest. -Return to work on Saturday if feeling better, otherwise contact clinic. -Call if symptoms do not improve in the next few days

## 2023-02-08 NOTE — Patient Instructions (Signed)
 VISIT SUMMARY:  You came in today with a four-day history of upper respiratory symptoms, including a scratchy throat, nasal congestion, and a headache. You also mentioned missing your period for the past month and are concerned about a possible pregnancy. We discussed your symptoms and concerns, and I have provided a plan to address each issue.  YOUR PLAN:  -UPPER RESPIRATORY INFECTION: An upper respiratory infection is a common viral infection that affects the nose, throat, and airways.  Testing for flu A and B as well as strep were both negative. Continue supportive care with Tylenol  as needed, Mucinex, nasal saline spray, fluids, and rest. You may return to work on Saturday if you are feeling better; otherwise, please contact the clinic.  -AMENORRHEA: Amenorrhea is the absence of menstruation.  Your urine pregnancy test was negative today. Please monitor for the return of your menstrual cycle and let us  know if you do not get your period in the next few weeks.  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 I have provided a work excuse for Wednesday, Thursday, and Friday. Plan to return to work on Saturday if you are feeling better.  INSTRUCTIONS:  Please follow up with the clinic if your symptoms do not improve or if you are not feeling well enough to return to work on Saturday. Additionally, monitor for the return of your menstrual cycle and contact us  if you have any further concerns.

## 2023-04-03 ENCOUNTER — Other Ambulatory Visit (HOSPITAL_COMMUNITY)
Admission: RE | Admit: 2023-04-03 | Discharge: 2023-04-03 | Disposition: A | Discharge status: Home / Self Care | Payer: Self-pay | Source: Ambulatory Visit | Attending: Family | Admitting: Family

## 2023-04-03 ENCOUNTER — Ambulatory Visit (INDEPENDENT_AMBULATORY_CARE_PROVIDER_SITE_OTHER): Payer: Self-pay | Admitting: Family

## 2023-04-03 VITALS — BP 141/96 | HR 102 | Temp 98.9°F | Resp 16 | Ht 60.0 in | Wt 212.0 lb

## 2023-04-03 DIAGNOSIS — N760 Acute vaginitis: Secondary | ICD-10-CM | POA: Insufficient documentation

## 2023-04-03 DIAGNOSIS — Z113 Encounter for screening for infections with a predominantly sexual mode of transmission: Secondary | ICD-10-CM | POA: Insufficient documentation

## 2023-04-03 NOTE — Assessment & Plan Note (Signed)
 Will swab for BV, Yeast (which I suspect will be +), GC/Chlamydia and trich. Further recommendations following review of these results.

## 2023-04-03 NOTE — Assessment & Plan Note (Addendum)
 Labs as ordered.  We discussed that she could probably [facility], but she prefers to do here today. She is self pay.

## 2023-04-03 NOTE — Patient Instructions (Signed)
 VISIT SUMMARY:  Today, we discussed your concerns about vaginal itching and the possibility of a sexually transmitted disease (STD). We reviewed your symptoms and your recent relationship history, and we talked about cost-effective treatment and testing options.  YOUR PLAN:  -VAGINAL ITCHING: Vaginal itching can be caused by various factors, including infections like candidiasis (a yeast infection) or sexually transmitted infections (STIs). We will perform a swab test for gonorrhea, chlamydia, and other STIs, and conduct blood work for HIV and syphilis. For candidiasis, you can use Monistat or Diflucan, and we discussed using GoodRx coupons and resources from the health department to help with costs. We will send your test results via MyChart and call you if any STI is detected. Additionally, we talked about free testing and medication available at the [facility] Department.  INSTRUCTIONS:  Please follow up with the recommended swab tests and blood work. Use Monistat or Diflucan for candidiasis as discussed, and utilize GoodRx coupons or health department resources if needed. We will send your test results through MyChart and call you if any STI is detected. If you have any questions or concerns, please do not hesitate to reach out.

## 2023-04-03 NOTE — Progress Notes (Signed)
 Subjective:     Patient ID: [name], female    DOB: [birth year], 33 y.o.   MRN: [id number]  Chief Complaint  Patient presents with   Vaginal Itching    Complains of vaginal itching with some discharge    Vaginal Itching    Discussed the use of AI scribe software for clinical note transcription with the patient, who gave verbal consent to proceed.  History of Present Illness     [name] is a 33 year old female who presents with vaginal itching and concerns about a possible STD.  She has been experiencing vaginal itching, primarily internal, which has shown improvement today. The itching is less noticeable when she wipes. She is concerned about the possibility of a sexually transmitted disease (STD), especially following a recent breakup with a partner whom she suspects of infidelity.  Her last relationship ended at the end of January, and she has been dealing with the emotional aftermath, including suspicions of infidelity. She has not discussed these issues with others and has been trying to maintain her composure at work.  She mentions financial constraints that may impact her ability to afford antibiotics if needed. She recalls a past STD for which she received free antibiotics and is worried about the cost of potential treatments now. She is aware of GoodRx coupons and has used them in the past.  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     Anxiety    Cardiomyopathy (HCC) 06/09/2010   Depression    Epistaxis 08/28/2012   Facial angiofibroma    Heart palpitations    Hypertension    Insomnia 04/07/2013   Irregular heartbeat    Nonspecific abnormal electrocardiogram (ECG) (EKG) 06/07/2010   Palpitations  06/23/2011   Preop cardiovascular exam 06/07/2010   Primary hypertension 05/16/2021    Past Surgical History:  Procedure Laterality Date   NASAL HEMORRHAGE CONTROL Bilateral 08/28/2012   Procedure: EPISTAXIS CONTROL;  Surgeon: Osborn Coho, MD;  Location: [facility] OR;  Service: ENT;  Laterality: Bilateral;    Family History  Problem Relation Age of Onset   Heart disease Father        Unknown type    Social History   Socioeconomic History   Marital status: Single    Spouse name: N/A   Number of children: 0   Years of education: Not on file   Highest education level: Not on file  Occupational History    Employer: [employer]   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  Family from [place].       Social Drivers of Corporate investment banker Strain: Not on file  Food Insecurity: Not on file  Transportation Needs: Not on file  Physical Activity: Not on file  Stress: Not  on file  Social Connections: Not on file  Intimate Partner Violence: Not on file    Outpatient Medications Prior to Visit  Medication Sig Dispense Refill   metoprolol tartrate (LOPRESSOR) 25 MG tablet Take one table by mouth at onset of Racing Heart symptoms. My repeat in 6 hours. (Patient not taking: Reported on 04/03/2023) 10 tablet 10   cetirizine (ZYRTEC) 10 MG tablet Take 1 tablet (10 mg total) by mouth daily. 30 tablet 11   fluticasone (FLONASE) 50 MCG/ACT nasal spray Place 2 sprays into both nostrils daily. 16 g 6   mupirocin ointment (BACTROBAN) 2 % Apply 1 Application topically 2 (two) times daily. 22 g 1   venlafaxine XR (EFFEXOR-XR) 37.5 MG 24 hr capsule Take  1 capsule (37.5 mg total) by mouth daily with breakfast. 60 capsule 0   No facility-administered medications prior to visit.    No Known Allergies  ROS See HPI    Objective:    Physical Exam Exam conducted with a chaperone present.  Constitutional:      Appearance: Normal appearance.  Genitourinary:    Exam position: Lithotomy position.     Pubic Area: No rash or pubic lice.      Labia:        Right: No rash.        Left: No rash.   Neurological:     Mental Status: She is alert.  Psychiatric:        Attention and Perception: Attention normal.        Mood and Affect: Mood is anxious.        Speech: Speech normal.        Behavior: Behavior normal.        Thought Content: Thought content normal.        Cognition and Memory: Cognition normal.        Judgment: Judgment normal.      BP (!) 141/96 (BP Location: Right Arm, Patient Position: Sitting, Cuff Size: Normal)   Pulse (!) 102   Temp 98.9 F (37.2 C) (Oral)   Resp 16   Ht 5' (1.524 m)   Wt 212 lb (96.2 kg)   SpO2 99%   BMI 41.40 kg/m  Wt Readings from Last 3 Encounters:  04/03/23 212 lb (96.2 kg)  02/07/23 204 lb (92.5 kg)  08/10/22 188 lb (85.3 kg)       Assessment & Plan:   Problem List Items Addressed This Visit       Unprioritized   Screening examination for STD (sexually transmitted disease)   Labs as ordered.  We discussed that she could probably get free screening and treatment at the Health Department, but she prefers to do here today. She is self pay.       Relevant Orders   HIV antibody (with reflex)   RPR   Herpes Simplex Virus 2(IgG)w/rflx to HSV2 Inhibition   Acute vaginitis - Primary   Will swab for BV, Yeast (which I suspect will be +), GC/Chlamydia and trich. Further recommendations following review of these results.       Relevant Orders   Cervicovaginal ancillary only( [place])   BP mildly elevated today- I attribute this to pt's anxiety today.   I have discontinued  Rachella Knipfer's fluticasone, cetirizine, venlafaxine XR, and mupirocin ointment. I am also having her maintain her metoprolol tartrate.  No orders of the defined types were placed in this encounter.

## 2023-04-04 LAB — HERPES SIMPLEX VIRUS 2(IGG) W/ REFLEX TO HSV2 INHIBITION: HSV 2 Glycoprotein G Ab, IgG: <0.9 {index}

## 2023-04-04 LAB — RPR: RPR Ser Ql: NONREACTIVE

## 2023-04-04 LAB — HIV ANTIBODY (ROUTINE TESTING W REFLEX): HIV 1&2 Ab, 4th Generation: NONREACTIVE

## 2023-04-05 ENCOUNTER — Telehealth: Payer: Self-pay | Admitting: Family

## 2023-04-05 LAB — CERVICOVAGINAL ANCILLARY ONLY
Bacterial Vaginitis (gardnerella): NEGATIVE
Candida Glabrata: NEGATIVE
Candida Vaginitis: POSITIVE — AB
Chlamydia: NEGATIVE
Comment: NEGATIVE
Comment: NEGATIVE
Comment: NEGATIVE
Comment: NEGATIVE
Comment: NEGATIVE
Comment: NORMAL
Neisseria Gonorrhea: NEGATIVE
Trichomonas: NEGATIVE

## 2023-04-05 NOTE — Telephone Encounter (Signed)
 See mychart.

## 2023-04-10 ENCOUNTER — Emergency Department (HOSPITAL_BASED_OUTPATIENT_CLINIC_OR_DEPARTMENT_OTHER)
Admission: EM | Admit: 2023-04-10 | Discharge: 2023-04-10 | Disposition: A | Discharge status: Home / Self Care | Payer: Self-pay | Attending: Emergency Medicine | Admitting: Emergency Medicine

## 2023-04-10 ENCOUNTER — Encounter (HOSPITAL_BASED_OUTPATIENT_CLINIC_OR_DEPARTMENT_OTHER): Payer: Self-pay | Admitting: Urology

## 2023-04-10 ENCOUNTER — Other Ambulatory Visit: Payer: Self-pay

## 2023-04-10 DIAGNOSIS — I10 Essential (primary) hypertension: Secondary | ICD-10-CM | POA: Insufficient documentation

## 2023-04-10 DIAGNOSIS — S61211A Laceration without foreign body of left index finger without damage to nail, initial encounter: Secondary | ICD-10-CM | POA: Insufficient documentation

## 2023-04-10 DIAGNOSIS — W260XXA Contact with knife, initial encounter: Secondary | ICD-10-CM | POA: Insufficient documentation

## 2023-04-10 DIAGNOSIS — Z79899 Other long term (current) drug therapy: Secondary | ICD-10-CM | POA: Insufficient documentation

## 2023-04-10 MED ORDER — BUPIVACAINE HCL (PF) 0.5 % IJ SOLN
10.0000 mL | Freq: Once | INTRAMUSCULAR | Status: AC
  Administered 2023-04-10: 10 mL
  Filled 2023-04-10: qty 10

## 2023-04-10 NOTE — ED Provider Notes (Signed)
 I provided a substantive portion of the care of this patient.  I personally made/approved the management plan for this patient and take responsibility for the patient management.     Laceration to the finger pad from cutting into a bag of pizza rolls.  linear laceration to the pad of the finger.  Agree with plan of management.   Arby Barrette, MD 04/10/23 [phone]

## 2023-04-10 NOTE — ED Notes (Signed)
 Discharge instructions reviewed.   Opportunity for questions and concerns provided.   Alert, oriented and displays no signs of distress.

## 2023-04-10 NOTE — ED Triage Notes (Signed)
 Left pointer finger lac while "opening a bag of totitos pizza rolls"  The knife slipped and hit finger   TDAP in 2022

## 2023-04-10 NOTE — ED Provider Notes (Signed)
 [place] [facility] Provider Note   CSN: [id number] Arrival date & time: 04/10/23  1639     History  Chief Complaint  Patient presents with   Laceration    [name] is a 33 y.o. female.  Patient with history of anxiety, depression, hypertension presents today with complaints of laceration. She states that same occurred immediately prior to arrival today when she was attempting to open a bag of pizza rolls with a knife and cut her left pointer finger with the knife. Denies any other injuries or complaints. Tdap is up to date.   The history is provided by the patient. No language interpreter was used.  Laceration      Home Medications Prior to Admission medications   Medication Sig Start Date End Date Taking? Authorizing Provider  metoprolol tartrate (LOPRESSOR) 25 MG tablet Take one table by mouth at onset of Racing Heart symptoms. My repeat in 6 hours. Patient not taking: Reported on 04/03/2023 07/20/22   Arthor Captain, PA-C      Allergies    Patient has no known allergies.    Review of Systems   Review of Systems  Skin:  Positive for wound.  All other systems reviewed and are negative.   Physical Exam Updated Vital Signs BP (!) 129/98 (BP Location: Right Arm)   Pulse 97   Temp 98.1 F (36.7 C)   Resp 15   Ht 5' (1.524 m)   Wt 96.2 kg   SpO2 98%   BMI 41.42 kg/m  Physical Exam Vitals and nursing note reviewed.  Constitutional:      General: She is not in acute distress.    Appearance: Normal appearance. She is normal weight. She is not ill-appearing, toxic-appearing or diaphoretic.  HENT:     Head: Normocephalic and atraumatic.  Cardiovascular:     Rate and Rhythm: Normal rate.  Pulmonary:     Effort: Pulmonary effort is normal. No respiratory distress.  Musculoskeletal:        General: Normal range of motion.     Cervical back: Normal range of motion.  Skin:    General: Skin is warm and dry.      Comments: 2 cm linear laceration noted to the palmar aspect of the distal left index finger. No active bleeding. Distal sensation intact. Good capillary refill. ROM intact without pain. No nailbed involvement.  Neurological:     General: No focal deficit present.     Mental Status: She is alert.  Psychiatric:        Mood and Affect: Mood normal.        Behavior: Behavior normal.     ED Results / Procedures / Treatments   Labs (all labs ordered are listed, but only abnormal results are displayed) Labs Reviewed - No data to display  EKG None  Radiology No results found.  Procedures .Laceration Repair  Date/Time: 04/10/2023 7:13 PM  Performed by: Silva Bandy, PA-C Authorized by: Silva Bandy, PA-C   Consent:    Consent obtained:  Verbal   Consent given by:  Patient   Risks, benefits, and alternatives were discussed: yes     Risks discussed:  Infection, need for additional repair, nerve damage, poor wound healing, retained foreign body, tendon damage, vascular damage, poor cosmetic result and pain Universal protocol:    Procedure explained and questions answered to patient or proxy's satisfaction: yes     Patient identity confirmed:  Verbally with patient Anesthesia:  Anesthesia method:  Nerve block   Block location:  Base of left index finger   Block needle gauge:  25 G   Block anesthetic:  Bupivacaine 0.5% w/o epi   Block technique:  Digital block   Block injection procedure:  Anatomic landmarks identified, anatomic landmarks palpated, negative aspiration for blood, introduced needle and incremental injection   Block outcome:  Anesthesia achieved Laceration details:    Location:  Finger   Finger location:  L index finger   Length (cm):  2   Depth (mm):  1 Exploration:    Hemostasis achieved with:  Direct pressure   Imaging outcome: foreign body not noted     Wound exploration: wound explored through full range of motion and entire depth of wound visualized    Treatment:    Area cleansed with:  Soap and water   Amount of cleaning:  Standard   Irrigation solution:  Sterile saline   Irrigation volume:  500 ml   Irrigation method:  Pressure wash Skin repair:    Repair method:  Sutures   Suture size:  4-0   Suture material:  Prolene   Suture technique:  Simple interrupted   Number of sutures:  3 Approximation:    Approximation:  Close Repair type:    Repair type:  Simple Post-procedure details:    Dressing:  Antibiotic ointment   Procedure completion:  Tolerated well, no immediate complications     Medications Ordered in ED Medications  bupivacaine(PF) (MARCAINE) 0.5 % injection 10 mL (10 mLs Infiltration Given by Other 04/10/23 1727)    ED Course/ Medical Decision Making/ A&P                                 Medical Decision Making Risk Prescription drug management.   Patient presents today with complaints of left index finger laceration immediately prior to arrival today. She is afebrile, non-toxic appearing, and in no acute distress with reassuring vital signs. Physical exam reveals 2 cm linear laceration noted to the palmar aspect of the distal left index finger. No active bleeding. Good distal capillary refill and sensation. ROM intact. Pressure irrigation performed. Wound explored and base of wound visualized in a bloodless field without evidence of foreign body.  Laceration occurred < 8 hours prior to repair which was well tolerated per above procedure. Tdap up-to-date.  Pt has  no comorbidities to effect normal wound healing. Pt discharged  without antibiotics.  Discussed suture home care with patient and answered questions. Pt to follow-up for wound check and suture removal in 7 days; they are to return to the ED sooner for signs of infection. Pt is hemodynamically stable with no complaints prior to dc. Evaluation and diagnostic testing in the emergency department does not suggest an emergent condition requiring admission or  immediate intervention beyond what has been performed at this time.  Plan for discharge with close PCP follow-up.  Patient is understanding and amenable with plan, educated on red flag symptoms that would prompt immediate return.  Patient discharged in stable condition.  Final Clinical Impression(s) / ED Diagnoses Final diagnoses:  Laceration of left index finger without foreign body without damage to nail, initial encounter    Rx / DC Orders ED Discharge Orders     None     An After Visit Summary was printed and given to the patient.     Silva Bandy, PA-C 04/10/23 1918  Arby Barrette, MD 04/19/23 [phone]

## 2023-04-10 NOTE — Discharge Instructions (Signed)
 You were seen in the emergency department for your left index finger laceration.   We have closed your laceration(s) with sutures. These need to be removed in 7 days. This can be done at any doctor's office, urgent care, or emergency department.   If any of the sutures come out before it is time for removal, that is okay. Make sure to keep the area as clean and dry as possible. You can let warm soapy water run over the area, but do NOT scrub it.   Watch out for signs of infection, like we discussed, including: increased redness, tenderness, or drainage of pus from the area. If this happens and you have not been prescribed an antibiotic, please seek medical attention for possible infection.   You can take over the counter pain medicine like ibuprofen or tylenol as needed.

## 2023-06-16 ENCOUNTER — Encounter (HOSPITAL_BASED_OUTPATIENT_CLINIC_OR_DEPARTMENT_OTHER): Payer: Self-pay | Admitting: Emergency Medicine

## 2023-06-16 ENCOUNTER — Emergency Department (HOSPITAL_BASED_OUTPATIENT_CLINIC_OR_DEPARTMENT_OTHER)
Admission: EM | Admit: 2023-06-16 | Discharge: 2023-06-16 | Disposition: A | Discharge status: Home / Self Care | Payer: Self-pay | Attending: Emergency Medicine | Admitting: Emergency Medicine

## 2023-06-16 ENCOUNTER — Emergency Department (HOSPITAL_BASED_OUTPATIENT_CLINIC_OR_DEPARTMENT_OTHER): Payer: Self-pay

## 2023-06-16 DIAGNOSIS — I10 Essential (primary) hypertension: Secondary | ICD-10-CM | POA: Insufficient documentation

## 2023-06-16 DIAGNOSIS — F419 Anxiety disorder, unspecified: Secondary | ICD-10-CM | POA: Insufficient documentation

## 2023-06-16 DIAGNOSIS — Z79899 Other long term (current) drug therapy: Secondary | ICD-10-CM | POA: Insufficient documentation

## 2023-06-16 LAB — BASIC METABOLIC PANEL WITH GFR
Anion gap: 14 (ref 5–15)
BUN: 10 mg/dL (ref 6–20)
CO2: 24 mmol/L (ref 22–32)
Calcium: 9.8 mg/dL (ref 8.9–10.3)
Chloride: 101 mmol/L (ref 98–111)
Creatinine, Ser: 0.69 mg/dL (ref 0.44–1.00)
GFR, Estimated: >60 mL/min (ref >60)
Glucose, Bld: 116 mg/dL — ABNORMAL HIGH (ref 70–99)
Potassium: 3.8 mmol/L (ref 3.5–5.1)
Sodium: 138 mmol/L (ref 135–145)

## 2023-06-16 LAB — CBC WITH DIFFERENTIAL/PLATELET
Abs Immature Granulocytes: 0.02 10*3/uL (ref 0.00–0.07)
Basophils Absolute: 0 10*3/uL (ref 0.0–0.1)
Basophils Relative: 1 %
Eosinophils Absolute: 0.1 10*3/uL (ref 0.0–0.5)
Eosinophils Relative: 1 %
HCT: 43 % (ref 36.0–46.0)
Hemoglobin: 14.1 g/dL (ref 12.0–15.0)
Immature Granulocytes: 0 %
Lymphocytes Relative: 28 %
Lymphs Abs: 2.2 10*3/uL (ref 0.7–4.0)
MCH: 26.6 pg (ref 26.0–34.0)
MCHC: 32.8 g/dL (ref 30.0–36.0)
MCV: 81.1 fL (ref 80.0–100.0)
Monocytes Absolute: 0.5 10*3/uL (ref 0.1–1.0)
Monocytes Relative: 7 %
Neutro Abs: 5 10*3/uL (ref 1.7–7.7)
Neutrophils Relative %: 63 %
Platelets: 250 10*3/uL (ref 150–400)
RBC: 5.3 MIL/uL — ABNORMAL HIGH (ref 3.87–5.11)
RDW: 14.2 % (ref 11.5–15.5)
WBC: 7.8 10*3/uL (ref 4.0–10.5)
nRBC: 0 % (ref 0.0–0.2)

## 2023-06-16 LAB — TROPONIN T, HIGH SENSITIVITY: Troponin T High Sensitivity: <15 ng/L (ref <19)

## 2023-06-16 LAB — D-DIMER, QUANTITATIVE: D-Dimer, Quant: 0.44 ug{FEU}/mL (ref 0.00–0.50)

## 2023-06-16 NOTE — Discharge Instructions (Addendum)
 It appears that you may have had a panic attack today.  Your workup today is reassuring. It is very unlikely that your symtoms are due to an issue in your heart.  Your cardiac enzyme (troponin) was normal today. Your EKG which is a measure of the heart's electrical activity and rhythm is normal today.   Your chest x-ray is normal today.  No signs of a blood clot.  Your blood counts, electrolytes, and kidney function are normal today.  As discussed, please follow-up with your PCP as needed for further management of your symptoms.  They may be able to refer you to therapy or help with anxiety medications.    Return to the ER if you have any shortness of breath, difficulty breathing, worsening chest pain, dizziness, jaw pain, left arm or shoulder pain, abdominal pain, unexplained fever, any other new or concerning symptoms.

## 2023-06-16 NOTE — ED Provider Notes (Signed)
 [place] [facility] Provider Note   CSN: [id number] Arrival date & time: 06/16/23  1240     History  Chief Complaint  Patient presents with   Shortness of Breath    [name] is a 33 y.o. female with history of hypertension, presents with concern for an episode of shortness of breath and heart racing that began earlier today at work.  States this came on suddenly and did not seem to be in response to anything in particular.  Symptoms have since been improving upon arrival to the ER. She denies any chest pain, abdominal pain, nausea or vomiting, lower extremity pain or swelling, recent long plane or car rides, recent hospitalizations or surgeries, or birth control use.  States she has been going through a lot of stress recently with a breakup.   Shortness of Breath      Home Medications Prior to Admission medications   Medication Sig Start Date End Date Taking? Authorizing Provider  metoprolol  tartrate (LOPRESSOR ) 25 MG tablet Take one table by mouth at onset of Racing Heart symptoms. My repeat in 6 hours. Patient not taking: Reported on 04/03/2023 07/20/22   Harris, Abigail, PA-C      Allergies    Patient has no known allergies.    Review of Systems   Review of Systems  Respiratory:  Positive for shortness of breath.     Physical Exam Updated Vital Signs BP 138/89   Pulse 89   Temp 98.2 F (36.8 C) (Oral)   Resp 20   Ht 5' (1.524 m)   Wt 96.2 kg   SpO2 100%   BMI 41.42 kg/m  Physical Exam Vitals and nursing note reviewed.  Constitutional:      General: She is not in acute distress.    Appearance: She is well-developed.  HENT:     Head: Normocephalic and atraumatic.  Eyes:     Conjunctiva/sclera: Conjunctivae normal.  Cardiovascular:     Rate and Rhythm: Regular rhythm. Tachycardia present.     Heart sounds: No murmur heard. Pulmonary:     Effort: Pulmonary effort is normal. No respiratory distress.     Breath  sounds: Normal breath sounds.     Comments: Talking in full sentences on room air without difficulty Abdominal:     Palpations: Abdomen is soft.     Tenderness: There is no abdominal tenderness.  Musculoskeletal:        General: No swelling.     Cervical back: Neck supple.     Right lower leg: No edema.     Left lower leg: No edema.  Skin:    General: Skin is warm and dry.     Capillary Refill: Capillary refill takes less than 2 seconds.  Neurological:     Mental Status: She is alert.  Psychiatric:        Mood and Affect: Mood normal.     ED Results / Procedures / Treatments   Labs (all labs ordered are listed, but only abnormal results are displayed) Labs Reviewed  CBC WITH DIFFERENTIAL/PLATELET - Abnormal; Notable for the following components:      Result Value   RBC 5.30 (*)    All other components within normal limits  BASIC METABOLIC PANEL WITH GFR - Abnormal; Notable for the following components:   Glucose, Bld 116 (*)    All other components within normal limits  D-DIMER, QUANTITATIVE  TROPONIN T, HIGH SENSITIVITY    EKG None  Radiology  DG Chest 2 View Result Date: 06/16/2023 CLINICAL DATA:  Shortness of breath.  Tachycardia.  Hypertension. EXAM: CHEST - 2 VIEW COMPARISON:  07/20/2022 FINDINGS: The heart size and mediastinal contours are within normal limits. Both lungs are clear. The visualized skeletal structures are unremarkable. IMPRESSION: No active cardiopulmonary disease. Electronically Signed   By: Marlyce Sine M.D.   On: 06/16/2023 13:31    Procedures Procedures    Medications Ordered in ED Medications - No data to display  ED Course/ Medical Decision Making/ A&P                                 Medical Decision Making Amount and/or Complexity of Data Reviewed Labs: ordered. Radiology: ordered.     Differential diagnosis includes but is not limited to panic attack, ACS, arrhythmia, GERD, DVT/PE, pneumonia, pleural effusion   ED  Course:  Upon initial evaluation, patient well appearing, stable vitals aside from mild tachycardia to 102 on monitor. Reported some shortness of breath earlier, but none currently. Talking in full sentences on room air without difficulty.    Labs Ordered: I Ordered, and personally interpreted labs.  The pertinent results include:   CBC and BMP unremarkable  D dimer within normal limits Troponin under 15  Imaging Studies ordered: I ordered imaging studies including chest x-ray   I independently visualized the imaging with scope of interpretation limited to determining acute life threatening conditions related to emergency care. Imaging showed no acute abnormalities I agree with the radiologist interpretation   Cardiac Monitoring: / EKG: The patient was maintained on a cardiac monitor.  I personally viewed and interpreted the cardiac monitored which showed an underlying rhythm of: Sinus tachycardia that improved to Normal sinus rhythm   Medications Given: Declines any anxiety medicine  Upon re-evaluation, patient still well appearing, HR improved into the 80's.   Low concern for ACS at this time given troponin remains stable with initial troponin of under 15, no chest pain, and cardiac monitoring improved to normal sinus rhythm and no ST changes. HEART score of 0. Chest x-ray without any acute abnormality. No concern for DVT or PE at this time given d-dimer within normal limits.  When talking to patient about her results, she does begin to get very tearful and explains all the stress she has been under. Given her description of sudden onset heart racing and shortness of breath, and resolved on its own, feel this may have been a panic attack.  I discussed that I could prescribe hydroxyzine  to use as needed for anxiety, but she declines at this time.  Low concern for other etiology at this time.  She is feeling back at baseline.  Stable and appropriate for discharge  home    Impression: Panic attack  Disposition:  The patient was discharged home with instructions to follow-up with PCP for further management of symptoms.  Continue with her home exercise regimen and journaling practices stable with stress at home. Return precautions given.      This chart was dictated using voice recognition software, Dragon. Despite the best efforts of this provider to proofread and correct errors, errors may still occur which can change documentation meaning.          Final Clinical Impression(s) / ED Diagnoses Final diagnoses:  Anxiety    Rx / DC Orders ED Discharge Orders     None         Billey Budds,  Colin Dawley 06/16/23 1504    Scarlette Currier, MD 06/17/23 [phone]

## 2023-06-16 NOTE — ED Triage Notes (Signed)
 Pt reports she feels SHOB and heart racing, BP was elevated at work;  no obvious distress; reports this happened suddenly while at work today; sts not sure if it is anxiety

## 2023-06-16 NOTE — ED Notes (Signed)
 Patient given discharge instructions. Questions were answered. Patient verbalized understanding of discharge instructions and care at home.

## 2023-09-22 ENCOUNTER — Emergency Department (HOSPITAL_BASED_OUTPATIENT_CLINIC_OR_DEPARTMENT_OTHER)
Admission: EM | Admit: 2023-09-22 | Discharge: 2023-09-22 | Disposition: A | Discharge status: Home / Self Care | Payer: Self-pay

## 2023-09-22 ENCOUNTER — Encounter (HOSPITAL_BASED_OUTPATIENT_CLINIC_OR_DEPARTMENT_OTHER): Payer: Self-pay | Admitting: Emergency Medicine

## 2023-09-22 ENCOUNTER — Other Ambulatory Visit: Payer: Self-pay

## 2023-09-22 DIAGNOSIS — R002 Palpitations: Secondary | ICD-10-CM | POA: Insufficient documentation

## 2023-09-22 DIAGNOSIS — R197 Diarrhea, unspecified: Secondary | ICD-10-CM | POA: Insufficient documentation

## 2023-09-22 LAB — COMPREHENSIVE METABOLIC PANEL WITH GFR
ALT: 23 U/L (ref 0–44)
AST: 27 U/L (ref 15–41)
Albumin: 4.6 g/dL (ref 3.5–5.0)
Alkaline Phosphatase: 102 U/L (ref 38–126)
Anion gap: 12 (ref 5–15)
BUN: 12 mg/dL (ref 6–20)
CO2: 24 mmol/L (ref 22–32)
Calcium: 9.8 mg/dL (ref 8.9–10.3)
Chloride: 101 mmol/L (ref 98–111)
Creatinine, Ser: 0.62 mg/dL (ref 0.44–1.00)
GFR, Estimated: >60 mL/min (ref >60)
Glucose, Bld: 88 mg/dL (ref 70–99)
Potassium: 4.3 mmol/L (ref 3.5–5.1)
Sodium: 137 mmol/L (ref 135–145)
Total Bilirubin: 0.4 mg/dL (ref 0.0–1.2)
Total Protein: 7.8 g/dL (ref 6.5–8.1)

## 2023-09-22 LAB — URINALYSIS, ROUTINE W REFLEX MICROSCOPIC
Bilirubin Urine: NEGATIVE
Glucose, UA: NEGATIVE mg/dL
Ketones, ur: NEGATIVE mg/dL
Leukocytes,Ua: NEGATIVE
Nitrite: NEGATIVE
Protein, ur: NEGATIVE mg/dL
Specific Gravity, Urine: >=1.03 (ref 1.005–1.030)
pH: 6 (ref 5.0–8.0)

## 2023-09-22 LAB — CBC WITH DIFFERENTIAL/PLATELET
Abs Immature Granulocytes: 0.02 K/uL (ref 0.00–0.07)
Basophils Absolute: 0 K/uL (ref 0.0–0.1)
Basophils Relative: 0 %
Eosinophils Absolute: 0.1 K/uL (ref 0.0–0.5)
Eosinophils Relative: 1 %
HCT: 42.7 % (ref 36.0–46.0)
Hemoglobin: 14.1 g/dL (ref 12.0–15.0)
Immature Granulocytes: 0 %
Lymphocytes Relative: 24 %
Lymphs Abs: 2.4 K/uL (ref 0.7–4.0)
MCH: 27 pg (ref 26.0–34.0)
MCHC: 33 g/dL (ref 30.0–36.0)
MCV: 81.8 fL (ref 80.0–100.0)
Monocytes Absolute: 0.7 K/uL (ref 0.1–1.0)
Monocytes Relative: 7 %
Neutro Abs: 6.6 K/uL (ref 1.7–7.7)
Neutrophils Relative %: 68 %
Platelets: 257 K/uL (ref 150–400)
RBC: 5.22 MIL/uL — ABNORMAL HIGH (ref 3.87–5.11)
RDW: 14.3 % (ref 11.5–15.5)
WBC: 9.7 K/uL (ref 4.0–10.5)
nRBC: 0 % (ref 0.0–0.2)

## 2023-09-22 LAB — URINALYSIS, MICROSCOPIC (REFLEX): Bacteria, UA: NONE SEEN

## 2023-09-22 LAB — PREGNANCY, URINE: Preg Test, Ur: NEGATIVE

## 2023-09-22 LAB — LIPASE, BLOOD: Lipase: 15 U/L (ref 11–51)

## 2023-09-22 NOTE — ED Provider Notes (Signed)
 [facility] Provider Note   CSN: [id number] Arrival date & time: 09/22/23  1419     Patient presents with: Diarrhea   Evalyse Stroope is a 33 y.o. female.   33 year old female presents for evaluation of diarrhea.  She states the last 2 days she had off-and-on diarrhea and today had formed stool that was dark.  She states after she had a bowel movement she had what felt like a racing heart rate and was unsure if she was having a panic attack or was dehydrated.  She states she came in just to get checked out.  She states her symptoms have resolved at this time.  She denies any other symptoms or concerns at this time.   Diarrhea Associated symptoms: no abdominal pain, no arthralgias, no chills, no fever and no vomiting        Prior to Admission medications   Medication Sig Start Date End Date Taking? Authorizing Provider  metoprolol  tartrate (LOPRESSOR ) 25 MG tablet Take one table by mouth at onset of Racing Heart symptoms. My repeat in 6 hours. Patient not taking: Reported on 04/03/2023 07/20/22   Harris, Abigail, PA-C    Allergies: Patient has no known allergies.    Review of Systems  Constitutional:  Negative for chills and fever.  HENT:  Negative for ear pain and sore throat.   Eyes:  Negative for pain and visual disturbance.  Respiratory:  Negative for cough and shortness of breath.   Cardiovascular:  Negative for chest pain and palpitations.  Gastrointestinal:  Positive for diarrhea. Negative for abdominal pain and vomiting.  Genitourinary:  Negative for dysuria and hematuria.  Musculoskeletal:  Negative for arthralgias and back pain.  Skin:  Negative for color change and rash.  Neurological:  Negative for seizures and syncope.  All other systems reviewed and are negative.   Updated Vital Signs BP (!) 136/101 (BP Location: Right Arm)   Pulse (!) 101   Temp 98.2 F (36.8 C) (Oral)   Resp (!) 24   Ht 5' (1.524 m)   Wt  97.5 kg   SpO2 99%   BMI 41.99 kg/m   Physical Exam Vitals and nursing note reviewed.  Constitutional:      General: She is not in acute distress.    Appearance: Normal appearance. She is well-developed. She is not ill-appearing.  HENT:     Head: Normocephalic and atraumatic.  Eyes:     Conjunctiva/sclera: Conjunctivae normal.  Cardiovascular:     Rate and Rhythm: Normal rate and regular rhythm.     Heart sounds: No murmur heard. Pulmonary:     Effort: Pulmonary effort is normal. No respiratory distress.     Breath sounds: Normal breath sounds.  Abdominal:     Palpations: Abdomen is soft.     Tenderness: There is no abdominal tenderness.  Musculoskeletal:        General: No swelling.     Cervical back: Neck supple.  Skin:    General: Skin is warm and dry.     Capillary Refill: Capillary refill takes less than 2 seconds.  Neurological:     Mental Status: She is alert.  Psychiatric:        Mood and Affect: Mood normal.     (all labs ordered are listed, but only abnormal results are displayed) Labs Reviewed  URINALYSIS, ROUTINE W REFLEX MICROSCOPIC - Abnormal; Notable for the following components:      Result Value   Hgb  urine dipstick MODERATE (*)    All other components within normal limits  CBC WITH DIFFERENTIAL/PLATELET - Abnormal; Notable for the following components:   RBC 5.22 (*)    All other components within normal limits  LIPASE, BLOOD  COMPREHENSIVE METABOLIC PANEL WITH GFR  PREGNANCY, URINE  URINALYSIS, MICROSCOPIC (REFLEX)    EKG: None  Radiology: No results found.   Procedures   Medications Ordered in the ED - No data to display                                  Medical Decision Making Patient with stable vital signs.  Her lab workup was reviewed by me and unremarkable.  She declined any medication for diarrhea or panic attacks at this time.  She thinks he may have a panic attack.  I advised her follow-up with primary care.  Advised Imodium  as needed for diarrhea and he can also diet and drink fluids.  Advised with primary care in 1 to 2 weeks otherwise return to the ER for any worsening symptoms.  She feels comfortable being discharged home.  Problems Addressed: Diarrhea, unspecified type: acute illness or injury Palpitations: acute illness or injury  Amount and/or Complexity of Data Reviewed External Data Reviewed: notes.    Details: Prior ED records reviewed and patient has been seen in the ER before for anxiety and panic attacks Labs: ordered. Decision-making details documented in ED Course.    Details: Ordered and reviewed by me and unremarkable for any acute abnormality  Risk OTC drugs. Prescription drug management.     Final diagnoses:  Diarrhea, unspecified type  Palpitations    ED Discharge Orders     None          Gennaro Duwaine CROME, DO 09/22/23 1653

## 2023-09-22 NOTE — Discharge Instructions (Addendum)
 Drink lots of fluids and a balanced diet.  You can use Imodium as needed for diarrhea.  Follow-up with your primary care doctor in 1 to 2 weeks.

## 2023-09-22 NOTE — ED Notes (Signed)

## 2023-09-22 NOTE — ED Triage Notes (Signed)
 Pt c/o diarrhea x 2d; was at work and felt HR was elevated and that she might be having a panic attack; NAD at this time
